# Patient Record
Sex: Female | Born: 1960 | Race: White | Hispanic: No | Marital: Married | Smoking: Never smoker
Health system: Southern US, Community
[De-identification: ages and names within clinical notes are randomized; demographics above are authoritative.]

## PROBLEM LIST (undated history)

## (undated) DIAGNOSIS — IMO0002 Reserved for concepts with insufficient information to code with codable children: Secondary | ICD-10-CM

## (undated) DIAGNOSIS — E7800 Pure hypercholesterolemia, unspecified: Secondary | ICD-10-CM

## (undated) DIAGNOSIS — D649 Anemia, unspecified: Secondary | ICD-10-CM

## (undated) DIAGNOSIS — S129XXA Fracture of neck, unspecified, initial encounter: Secondary | ICD-10-CM

## (undated) DIAGNOSIS — G5600 Carpal tunnel syndrome, unspecified upper limb: Secondary | ICD-10-CM

## (undated) HISTORY — DX: Fracture of neck, unspecified, initial encounter: S12.9XXA

## (undated) HISTORY — PX: FINGER ARTHROSCOPY WITH CARPOMETACARPEL (CMC) ARTHROPLASTY: SHX5629

## (undated) HISTORY — DX: Anemia, unspecified: D64.9

## (undated) HISTORY — PX: COLONOSCOPY: SHX174

## (undated) HISTORY — PX: TONSILLECTOMY: SUR1361

---

## 1997-09-03 HISTORY — PX: CERVICAL FUSION: SHX112

## 2004-12-12 ENCOUNTER — Ambulatory Visit: Payer: Self-pay | Admitting: Family Medicine

## 2005-04-19 ENCOUNTER — Ambulatory Visit: Payer: Self-pay | Admitting: Unknown Physician Specialty

## 2005-06-01 ENCOUNTER — Ambulatory Visit: Payer: Self-pay | Admitting: Unknown Physician Specialty

## 2006-11-27 ENCOUNTER — Ambulatory Visit: Payer: Self-pay

## 2011-03-19 ENCOUNTER — Ambulatory Visit: Payer: Self-pay | Admitting: Gastroenterology

## 2014-04-01 ENCOUNTER — Other Ambulatory Visit: Payer: Self-pay | Admitting: Orthopedic Surgery

## 2014-04-20 ENCOUNTER — Encounter (HOSPITAL_BASED_OUTPATIENT_CLINIC_OR_DEPARTMENT_OTHER): Payer: Self-pay | Admitting: *Deleted

## 2014-04-20 NOTE — Progress Notes (Signed)
No labs needed

## 2014-04-22 ENCOUNTER — Ambulatory Visit (HOSPITAL_BASED_OUTPATIENT_CLINIC_OR_DEPARTMENT_OTHER)
Admission: RE | Admit: 2014-04-22 | Discharge: 2014-04-22 | Disposition: A | Discharge status: Home / Self Care | Payer: PRIVATE HEALTH INSURANCE | Source: Ambulatory Visit | Attending: Orthopedic Surgery | Admitting: Orthopedic Surgery

## 2014-04-22 ENCOUNTER — Encounter (HOSPITAL_BASED_OUTPATIENT_CLINIC_OR_DEPARTMENT_OTHER)
Admission: RE | Disposition: A | Discharge status: Home / Self Care | Payer: Self-pay | Source: Ambulatory Visit | Attending: Orthopedic Surgery

## 2014-04-22 ENCOUNTER — Encounter (HOSPITAL_BASED_OUTPATIENT_CLINIC_OR_DEPARTMENT_OTHER): Payer: PRIVATE HEALTH INSURANCE | Admitting: Anesthesiology

## 2014-04-22 ENCOUNTER — Encounter (HOSPITAL_BASED_OUTPATIENT_CLINIC_OR_DEPARTMENT_OTHER): Payer: Self-pay | Admitting: *Deleted

## 2014-04-22 ENCOUNTER — Ambulatory Visit (HOSPITAL_BASED_OUTPATIENT_CLINIC_OR_DEPARTMENT_OTHER): Payer: PRIVATE HEALTH INSURANCE | Admitting: Anesthesiology

## 2014-04-22 DIAGNOSIS — IMO0002 Reserved for concepts with insufficient information to code with codable children: Secondary | ICD-10-CM | POA: Insufficient documentation

## 2014-04-22 DIAGNOSIS — Z79899 Other long term (current) drug therapy: Secondary | ICD-10-CM | POA: Insufficient documentation

## 2014-04-22 DIAGNOSIS — G5600 Carpal tunnel syndrome, unspecified upper limb: Secondary | ICD-10-CM | POA: Diagnosis present

## 2014-04-22 HISTORY — PX: CARPAL TUNNEL RELEASE: SHX101

## 2014-04-22 HISTORY — DX: Reserved for concepts with insufficient information to code with codable children: IMO0002

## 2014-04-22 HISTORY — DX: Carpal tunnel syndrome, unspecified upper limb: G56.00

## 2014-04-22 LAB — POCT HEMOGLOBIN-HEMACUE: HEMOGLOBIN: 12.5 g/dL (ref 12.0–15.0)

## 2014-04-22 SURGERY — CARPAL TUNNEL RELEASE
Anesthesia: Monitor Anesthesia Care | Site: Wrist | Laterality: Left

## 2014-04-22 MED ORDER — MIDAZOLAM HCL 2 MG/2ML IJ SOLN: 1.0000 mg | INTRAMUSCULAR | Status: DC | PRN

## 2014-04-22 MED ORDER — HYDROMORPHONE HCL PF 1 MG/ML IJ SOLN: 0.2500 mg | INTRAMUSCULAR | Status: DC | PRN

## 2014-04-22 MED ORDER — CEFAZOLIN SODIUM-DEXTROSE 2-3 GM-% IV SOLR
2.0000 g | INTRAVENOUS | Status: AC
  Administered 2014-04-22: 2 g via INTRAVENOUS

## 2014-04-22 MED ORDER — LACTATED RINGERS IV SOLN
INTRAVENOUS | Status: DC
  Administered 2014-04-22: 08:00:00 via INTRAVENOUS

## 2014-04-22 MED ORDER — ONDANSETRON HCL 4 MG/2ML IJ SOLN
INTRAMUSCULAR | Status: DC | PRN
  Administered 2014-04-22: 4 mg via INTRAVENOUS

## 2014-04-22 MED ORDER — FENTANYL CITRATE 0.05 MG/ML IJ SOLN: 50.0000 ug | INTRAMUSCULAR | Status: DC | PRN

## 2014-04-22 MED ORDER — OXYCODONE HCL 5 MG/5ML PO SOLN: 5.0000 mg | Freq: Once | ORAL | Status: DC | PRN

## 2014-04-22 MED ORDER — FENTANYL CITRATE 0.05 MG/ML IJ SOLN
INTRAMUSCULAR | Status: DC | PRN
  Administered 2014-04-22: 50 ug via INTRAVENOUS

## 2014-04-22 MED ORDER — CEFAZOLIN SODIUM-DEXTROSE 2-3 GM-% IV SOLR
INTRAVENOUS | Status: AC
  Filled 2014-04-22: qty 50

## 2014-04-22 MED ORDER — PROPOFOL INFUSION 10 MG/ML OPTIME
INTRAVENOUS | Status: DC | PRN
  Administered 2014-04-22: 50 ug/kg/min via INTRAVENOUS

## 2014-04-22 MED ORDER — LIDOCAINE HCL (PF) 0.5 % IJ SOLN
INTRAMUSCULAR | Status: DC | PRN
  Administered 2014-04-22: 25 mL via INTRAVENOUS

## 2014-04-22 MED ORDER — OXYCODONE HCL 5 MG PO TABS: 5.0000 mg | ORAL_TABLET | Freq: Once | ORAL | Status: DC | PRN

## 2014-04-22 MED ORDER — 0.9 % SODIUM CHLORIDE (POUR BTL) OPTIME
TOPICAL | Status: DC | PRN
  Administered 2014-04-22: 500 mL

## 2014-04-22 MED ORDER — MIDAZOLAM HCL 5 MG/5ML IJ SOLN
INTRAMUSCULAR | Status: DC | PRN
  Administered 2014-04-22: 2 mg via INTRAVENOUS

## 2014-04-22 MED ORDER — ONDANSETRON HCL 4 MG/2ML IJ SOLN
4.0000 mg | Freq: Once | INTRAMUSCULAR | Status: DC | PRN
Start: 2014-04-22 — End: 2014-04-22

## 2014-04-22 MED ORDER — CHLORHEXIDINE GLUCONATE 4 % EX LIQD: 60.0000 mL | Freq: Once | CUTANEOUS | Status: DC

## 2014-04-22 MED ORDER — BUPIVACAINE HCL (PF) 0.25 % IJ SOLN
INTRAMUSCULAR | Status: DC | PRN
  Administered 2014-04-22: 10 mL

## 2014-04-22 MED ORDER — BUPIVACAINE HCL (PF) 0.25 % IJ SOLN
INTRAMUSCULAR | Status: AC
  Filled 2014-04-22: qty 30

## 2014-04-22 MED ORDER — FENTANYL CITRATE 0.05 MG/ML IJ SOLN
INTRAMUSCULAR | Status: AC
  Filled 2014-04-22: qty 4

## 2014-04-22 MED ORDER — MIDAZOLAM HCL 2 MG/2ML IJ SOLN
INTRAMUSCULAR | Status: AC
  Filled 2014-04-22: qty 2

## 2014-04-22 MED ORDER — HYDROCODONE-ACETAMINOPHEN 5-325 MG PO TABS: ORAL_TABLET | ORAL | Status: DC

## 2014-04-22 SURGICAL SUPPLY — 36 items
BANDAGE ELASTIC 3 VELCRO ST LF (GAUZE/BANDAGES/DRESSINGS) ×2 IMPLANT
BLADE MINI RND TIP GREEN BEAV (BLADE) IMPLANT
BLADE SURG 15 STRL LF DISP TIS (BLADE) ×2 IMPLANT
BLADE SURG 15 STRL SS (BLADE) ×2
BNDG ESMARK 4X9 LF (GAUZE/BANDAGES/DRESSINGS) IMPLANT
BNDG GAUZE ELAST 4 BULKY (GAUZE/BANDAGES/DRESSINGS) ×2 IMPLANT
CHLORAPREP W/TINT 26ML (MISCELLANEOUS) ×2 IMPLANT
CORDS BIPOLAR (ELECTRODE) ×2 IMPLANT
COVER MAYO STAND STRL (DRAPES) ×2 IMPLANT
COVER TABLE BACK 60X90 (DRAPES) ×2 IMPLANT
CUFF TOURNIQUET SINGLE 18IN (TOURNIQUET CUFF) ×2 IMPLANT
DRAPE EXTREMITY T 121X128X90 (DRAPE) ×2 IMPLANT
DRAPE SURG 17X23 STRL (DRAPES) ×2 IMPLANT
DRSG PAD ABDOMINAL 8X10 ST (GAUZE/BANDAGES/DRESSINGS) ×2 IMPLANT
GAUZE SPONGE 4X4 12PLY STRL (GAUZE/BANDAGES/DRESSINGS) ×2 IMPLANT
GAUZE XEROFORM 1X8 LF (GAUZE/BANDAGES/DRESSINGS) ×2 IMPLANT
GLOVE BIO SURGEON STRL SZ7.5 (GLOVE) ×2 IMPLANT
GLOVE BIOGEL PI IND STRL 7.0 (GLOVE) ×1 IMPLANT
GLOVE BIOGEL PI IND STRL 8 (GLOVE) ×1 IMPLANT
GLOVE BIOGEL PI INDICATOR 7.0 (GLOVE) ×1
GLOVE BIOGEL PI INDICATOR 8 (GLOVE) ×1
GLOVE ECLIPSE 7.0 STRL STRAW (GLOVE) ×2 IMPLANT
GOWN STRL REUS W/ TWL LRG LVL3 (GOWN DISPOSABLE) ×1 IMPLANT
GOWN STRL REUS W/TWL LRG LVL3 (GOWN DISPOSABLE) ×1
GOWN STRL REUS W/TWL XL LVL3 (GOWN DISPOSABLE) ×2 IMPLANT
NEEDLE HYPO 25X1 1.5 SAFETY (NEEDLE) ×2 IMPLANT
NS IRRIG 1000ML POUR BTL (IV SOLUTION) ×2 IMPLANT
PACK BASIN DAY SURGERY FS (CUSTOM PROCEDURE TRAY) ×2 IMPLANT
PADDING CAST ABS 4INX4YD NS (CAST SUPPLIES)
PADDING CAST ABS COTTON 4X4 ST (CAST SUPPLIES) IMPLANT
STOCKINETTE 4X48 STRL (DRAPES) ×2 IMPLANT
SUT ETHILON 4 0 PS 2 18 (SUTURE) ×2 IMPLANT
SYR BULB 3OZ (MISCELLANEOUS) ×2 IMPLANT
SYR CONTROL 10ML LL (SYRINGE) ×2 IMPLANT
TOWEL OR 17X24 6PK STRL BLUE (TOWEL DISPOSABLE) ×2 IMPLANT
UNDERPAD 30X30 INCONTINENT (UNDERPADS AND DIAPERS) ×2 IMPLANT

## 2014-04-22 NOTE — Anesthesia Preprocedure Evaluation (Addendum)
Anesthesia Evaluation  Patient identified by MRN, date of birth, ID band Patient awake    Reviewed: Allergy & Precautions, H&P , NPO status , Patient's Chart, lab work & pertinent test results  Airway Mallampati: I TM Distance: >3 FB Neck ROM: Full    Dental  (+) Teeth Intact, Dental Advisory Given   Pulmonary  breath sounds clear to auscultation        Cardiovascular Rhythm:Regular Rate:Normal     Neuro/Psych    GI/Hepatic   Endo/Other    Renal/GU      Musculoskeletal   Abdominal   Peds  Hematology   Anesthesia Other Findings   Reproductive/Obstetrics                           Anesthesia Physical Anesthesia Plan  ASA: I  Anesthesia Plan: MAC and Bier Block   Post-op Pain Management:    Induction: Intravenous  Airway Management Planned: Simple Face Mask  Additional Equipment:   Intra-op Plan:   Post-operative Plan:   Informed Consent: I have reviewed the patients History and Physical, chart, labs and discussed the procedure including the risks, benefits and alternatives for the proposed anesthesia with the patient or authorized representative who has indicated his/her understanding and acceptance.   Dental advisory given  Plan Discussed with: CRNA, Anesthesiologist and Surgeon  Anesthesia Plan Comments:         Anesthesia Quick Evaluation

## 2014-04-22 NOTE — Anesthesia Procedure Notes (Signed)
Anesthesia Regional Block:  Bier block (IV Regional)  Pre-Anesthetic Checklist: ,, timeout performed, Correct Patient, Correct Site, Correct Laterality, Correct Procedure,, site marked, surgical consent,, at surgeon's request Needles:  Injection technique: Single-shot  Needle Type: Other      Needle Gauge: 20 and 20 G    Additional Needles: Bier block (IV Regional)  Nerve Stimulator or Paresthesia:   Additional Responses:  Pulse checked post tourniquet inflation. IV NSL discontinued post injection. Narrative:   Performed by: Personally       

## 2014-04-22 NOTE — Op Note (Signed)
NAMERondalyn, Johns NO.:  [id number]  MEDICAL RECORD NO.:  [id number]  LOCATION:                                 FACILITY:  PHYSICIAN:  Leanora Cover, MD             DATE OF BIRTH:  DATE OF PROCEDURE:  04/22/2014 DATE OF DISCHARGE:                              OPERATIVE REPORT   PREOPERATIVE DIAGNOSIS:  Left carpal tunnel syndrome.  POSTOPERATIVE DIAGNOSIS:  Left carpal tunnel syndrome.  PROCEDURE:  Left carpal tunnel release.  SURGEON:  Leanora Cover, MD  ASSISTANT:  None.  ANESTHESIA:  Bier block with sedation.  IV FLUIDS:  Per anesthesia flow sheet.  ESTIMATED BLOOD LOSS:  Minimal.  COMPLICATIONS:  None.  SPECIMENS:  None.  TOURNIQUET TIME:  25 minutes.  DISPOSITION:  Stable to PACU.  INDICATIONS:  Joy Johns is a 53 year old female who has had pin and needle sensation in the bilateral hands.  She has positive nerve conduction studies.  She wished to have a carpal tunnel release for management of her symptoms.  Risks, benefits, alternatives of the surgery were discussed including risk of blood loss, infection, damage to nerves, vessels, tendons, ligaments, bone; failure of surgery; need for additional surgery, complications with wound healing, continued pain, continued carpal tunnel syndrome.  She voiced understanding of these risks and elected to proceed.  OPERATIVE COURSE:  After being identified preoperatively by myself, the patient and I agreed upon procedure and site of procedure.  Surgical site was marked.  The risks, benefits, and alternatives of surgery were reviewed and she wished to proceed.  Surgical consent had been signed. She was given IV Ancef as preoperative antibiotic prophylaxis.  She was transferred to the operating room and placed on the operating table in supine position with the left upper extremity on arm board.  Bier block anesthesia was induced by the anesthesiologist.  The left upper extremity was prepped and draped in  normal sterile orthopedic fashion. Surgical pause was performed between surgeons, Anesthesia, and operating staff, and all were in agreement as to the patient, procedure, and site of procedure.  Tourniquet at the proximal aspect of the forearm had been inflated for the Bier block.  Incision was made over the transverse carpal ligament and carried into subcutaneous tissues by spreading technique.  Bipolar electrocautery was used to obtain hemostasis.  The transverse carpal ligament was identified and sharply incised.  It was incised distally.  Care was taken to ensure complete decompression distally.  It was then incised proximally.  Scissors were used to split the distal aspect of ulnar and brachial fascia.  A finger was placed into the wound to ensure complete decompression which was the case.  The nerve was inspected which was flattened and hyperemic.  The motor branch was identified and was intact.  The wound was copiously irrigated with sterile saline.  It was closed with 4-0 nylon in a horizontal mattress fashion and then injected with 10 mL of 0.25% plain Marcaine to aid in postoperative analgesia.  It was dressed with sterile Xeroform, 4x4s, and ABD and wrapped with Kerlix and Ace  bandage.  Tourniquet was deflated at 25 minutes.  The fingertips were pink with brisk capillary refill after deflation of tourniquet.  The operative drapes were broken down.  The patient was awoken from anesthesia safely.  She was transferred back to the stretcher and taken to PACU in stable condition. I will see her back in the office in 1 week for postoperative followup. I will give her Norco 5/325, 1-2 p.o. q.6 hours p.r.n. pain, dispensed #30.     Leanora Cover, MD     KK/MEDQ  D:  04/22/2014  T:  04/22/2014  Job:  [id number]

## 2014-04-22 NOTE — Op Note (Signed)
[id number] 

## 2014-04-22 NOTE — H&P (Signed)
  Joy Johns is an 53 y.o. female.   Chief Complaint: carpal tunnel syndrome HPI: 53 yo rhd female with pins and needles sensation in bilateral hands ~4 months.  It is bothersome to her.  Positive nerve conduction studies.  She wishes to have a carpal tunnel release for management of symptoms.  Past Medical History  Diagnosis Date  . Carpal tunnel syndrome   . DDD (degenerative disc disease)     Past Surgical History  Procedure Laterality Date  . Tonsillectomy    . Colonoscopy    . Finger arthroscopy with carpometacarpel (cmc) arthroplasty      both thumbs  . Cervical fusion  1999    History reviewed. No pertinent family history. Social History:  reports that she has never smoked. She does not have any smokeless tobacco history on file. She reports that she drinks alcohol. She reports that she does not use illicit drugs.  Allergies:  Allergies  Allergen Reactions  . Latex     headache  . Shrimp [Shellfish Allergy]     Headache-congestion    Medications Prior to Admission  Medication Sig Dispense Refill  . calcium carbonate (OS-CAL) 600 MG TABS tablet Take 600 mg by mouth 2 (two) times daily with a meal.      . docusate sodium (COLACE) 100 MG capsule Take 100 mg by mouth daily.      . Multiple Vitamins-Minerals (MULTIVITAMIN WITH MINERALS) tablet Take 1 tablet by mouth daily.      . sertraline (ZOLOFT) 50 MG tablet Take 50 mg by mouth at bedtime.        Results for orders placed during the hospital encounter of 04/22/14 (from the past 48 hour(s))  POCT HEMOGLOBIN-HEMACUE     Status: None   Collection Time    04/22/14  7:31 AM      Result Value Ref Range   Hemoglobin 12.5  12.0 - 15.0 g/dL    No results found.   A comprehensive review of systems was negative except for: Eyes: positive for contacts/glasses Behavioral/Psych: positive for depression  Blood pressure 106/67, pulse 63, temperature 97.8 F (36.6 C), temperature source Oral, resp. rate 18, height 5\' 2"   (1.575 m), weight 60.782 kg (134 lb), SpO2 100.00%.  General appearance: alert, cooperative and appears stated age Head: Normocephalic, without obvious abnormality, atraumatic Neck: supple, symmetrical, trachea midline Resp: clear to auscultation bilaterally Cardio: regular rate and rhythm GI: non tender Extremities: intact sensation and capillary refill all digits.  +epl/fpl/io.  no wounds. Pulses: 2+ and symmetric Skin: Skin color, texture, turgor normal. No rashes or lesions Neurologic: Grossly normal Incision/Wound: none  Assessment/Plan Left carpal tunnel syndrome.  Non operative and operative treatment options were discussed with the patient and patient wishes to proceed with operative treatment. Risks, benefits, and alternatives of surgery were discussed and the patient agrees with the plan of care.   Jannett Schmall R 04/22/2014, 8:32 AM

## 2014-04-22 NOTE — Discharge Instructions (Addendum)

## 2014-04-22 NOTE — Anesthesia Postprocedure Evaluation (Signed)
  Anesthesia Post-op Note  Patient: Joy Johns  Procedure(s) Performed: Procedure(s): LEFT CARPAL TUNNEL RELEASE (Left)  Patient Location: PACU  Anesthesia Type: MAC, Bier Block   Level of Consciousness: awake, alert  and oriented  Airway and Oxygen Therapy: Patient Spontanous Breathing  Post-op Pain: mild  Post-op Assessment: Post-op Vital signs reviewed  Post-op Vital Signs: Reviewed  Last Vitals:  Filed Vitals:   04/22/14 0950  BP: 94/63  Pulse: 58  Temp: 36.6 C  Resp: 16    Complications: No apparent anesthesia complications

## 2014-04-22 NOTE — Transfer of Care (Signed)
Immediate Anesthesia Transfer of Care Note  Patient: Joy Johns  Procedure(s) Performed: Procedure(s): LEFT CARPAL TUNNEL RELEASE (Left)  Patient Location: PACU  Anesthesia Type:MAC and Bier block  Level of Consciousness: awake, alert  and oriented  Airway & Oxygen Therapy: Patient Spontanous Breathing  Post-op Assessment: Report given to PACU RN and Post -op Vital signs reviewed and stable  Post vital signs: Reviewed and stable  Complications: No apparent anesthesia complications

## 2014-04-22 NOTE — Brief Op Note (Signed)
04/22/2014  9:15 AM  PATIENT:  Joy Johns  53 y.o. female  PRE-OPERATIVE DIAGNOSIS:  LEFT CARPAL TUNNEL SYNDROME   POST-OPERATIVE DIAGNOSIS:  LEFT CARPAL TUNNEL SYNDROME   PROCEDURE:  Procedure(s): LEFT CARPAL TUNNEL RELEASE (Left)  SURGEON:  Surgeon(s) and Role:    * Leanora Cover, MD - Primary  PHYSICIAN ASSISTANT:   ASSISTANTS: none   ANESTHESIA:   Bier block with sedation  EBL:  Total I/O In: 800 [I.V.:800] Out: 0   BLOOD ADMINISTERED:none  DRAINS: none   LOCAL MEDICATIONS USED:  MARCAINE     SPECIMEN:  No Specimen  DISPOSITION OF SPECIMEN:  N/A  COUNTS:  YES  TOURNIQUET:   Total Tourniquet Time Documented: Forearm (Left) - 25 minutes Total: Forearm (Left) - 25 minutes   DICTATION: .Other Dictation: Dictation Number [id number]  PLAN OF CARE: Discharge to home after PACU  PATIENT DISPOSITION:  PACU - hemodynamically stable.

## 2014-04-23 ENCOUNTER — Encounter (HOSPITAL_BASED_OUTPATIENT_CLINIC_OR_DEPARTMENT_OTHER): Payer: Self-pay | Admitting: Orthopedic Surgery

## 2014-04-23 NOTE — Addendum Note (Signed)
Addendum created 04/23/14 1217 by Tawni Millers, CRNA   Modules edited: Charges VN

## 2014-08-12 ENCOUNTER — Other Ambulatory Visit: Payer: Self-pay | Admitting: Orthopedic Surgery

## 2014-08-17 ENCOUNTER — Encounter (HOSPITAL_BASED_OUTPATIENT_CLINIC_OR_DEPARTMENT_OTHER): Payer: Self-pay | Admitting: *Deleted

## 2014-08-17 NOTE — Progress Notes (Signed)
Here 8/15 for [facility]-did well

## 2014-08-19 ENCOUNTER — Encounter (HOSPITAL_BASED_OUTPATIENT_CLINIC_OR_DEPARTMENT_OTHER)
Admission: RE | Disposition: A | Discharge status: Home / Self Care | Payer: Self-pay | Source: Ambulatory Visit | Attending: Orthopedic Surgery

## 2014-08-19 ENCOUNTER — Ambulatory Visit (HOSPITAL_BASED_OUTPATIENT_CLINIC_OR_DEPARTMENT_OTHER): Payer: PRIVATE HEALTH INSURANCE | Admitting: Certified Registered"

## 2014-08-19 ENCOUNTER — Ambulatory Visit (HOSPITAL_BASED_OUTPATIENT_CLINIC_OR_DEPARTMENT_OTHER)
Admission: RE | Admit: 2014-08-19 | Discharge: 2014-08-19 | Disposition: A | Discharge status: Home / Self Care | Payer: PRIVATE HEALTH INSURANCE | Source: Ambulatory Visit | Attending: Orthopedic Surgery | Admitting: Orthopedic Surgery

## 2014-08-19 ENCOUNTER — Encounter (HOSPITAL_BASED_OUTPATIENT_CLINIC_OR_DEPARTMENT_OTHER): Payer: Self-pay | Admitting: *Deleted

## 2014-08-19 DIAGNOSIS — G5601 Carpal tunnel syndrome, right upper limb: Secondary | ICD-10-CM | POA: Insufficient documentation

## 2014-08-19 DIAGNOSIS — Z9889 Other specified postprocedural states: Secondary | ICD-10-CM | POA: Diagnosis not present

## 2014-08-19 DIAGNOSIS — M1990 Unspecified osteoarthritis, unspecified site: Secondary | ICD-10-CM | POA: Diagnosis not present

## 2014-08-19 HISTORY — PX: CARPAL TUNNEL RELEASE: SHX101

## 2014-08-19 LAB — POCT HEMOGLOBIN-HEMACUE: Hemoglobin: 13 g/dL (ref 12.0–15.0)

## 2014-08-19 SURGERY — CARPAL TUNNEL RELEASE
Anesthesia: Monitor Anesthesia Care | Site: Hand | Laterality: Right

## 2014-08-19 MED ORDER — PROPOFOL 10 MG/ML IV BOLUS
INTRAVENOUS | Status: AC
  Filled 2014-08-19: qty 20

## 2014-08-19 MED ORDER — ONDANSETRON HCL 4 MG/2ML IJ SOLN
INTRAMUSCULAR | Status: DC | PRN
  Administered 2014-08-19: 4 mg via INTRAVENOUS

## 2014-08-19 MED ORDER — PROPOFOL INFUSION 10 MG/ML OPTIME
INTRAVENOUS | Status: DC | PRN
  Administered 2014-08-19: 50 ug/kg/min via INTRAVENOUS

## 2014-08-19 MED ORDER — MIDAZOLAM HCL 2 MG/2ML IJ SOLN
INTRAMUSCULAR | Status: AC
  Filled 2014-08-19: qty 2

## 2014-08-19 MED ORDER — FENTANYL CITRATE 0.05 MG/ML IJ SOLN
INTRAMUSCULAR | Status: AC
  Filled 2014-08-19: qty 4

## 2014-08-19 MED ORDER — LIDOCAINE HCL (PF) 0.5 % IJ SOLN
INTRAMUSCULAR | Status: DC | PRN
  Administered 2014-08-19: 25 mL via INTRAVENOUS

## 2014-08-19 MED ORDER — FENTANYL CITRATE 0.05 MG/ML IJ SOLN
INTRAMUSCULAR | Status: DC | PRN
  Administered 2014-08-19 (×2): 50 ug via INTRAVENOUS

## 2014-08-19 MED ORDER — LACTATED RINGERS IV SOLN
INTRAVENOUS | Status: DC
  Administered 2014-08-19: 11:00:00 via INTRAVENOUS

## 2014-08-19 MED ORDER — CHLORHEXIDINE GLUCONATE 4 % EX LIQD: 60.0000 mL | Freq: Once | CUTANEOUS | Status: DC

## 2014-08-19 MED ORDER — HYDROCODONE-ACETAMINOPHEN 5-325 MG PO TABS: ORAL_TABLET | ORAL | Status: DC

## 2014-08-19 MED ORDER — FENTANYL CITRATE 0.05 MG/ML IJ SOLN: 50.0000 ug | INTRAMUSCULAR | Status: DC | PRN

## 2014-08-19 MED ORDER — MIDAZOLAM HCL 2 MG/2ML IJ SOLN: 1.0000 mg | INTRAMUSCULAR | Status: DC | PRN

## 2014-08-19 MED ORDER — BUPIVACAINE HCL (PF) 0.25 % IJ SOLN
INTRAMUSCULAR | Status: DC | PRN
  Administered 2014-08-19: 10 mL

## 2014-08-19 MED ORDER — MIDAZOLAM HCL 5 MG/5ML IJ SOLN
INTRAMUSCULAR | Status: DC | PRN
  Administered 2014-08-19: 2 mg via INTRAVENOUS

## 2014-08-19 MED ORDER — ONDANSETRON HCL 4 MG/2ML IJ SOLN: 4.0000 mg | Freq: Four times a day (QID) | INTRAMUSCULAR | Status: DC | PRN

## 2014-08-19 MED ORDER — CEFAZOLIN SODIUM-DEXTROSE 2-3 GM-% IV SOLR
2.0000 g | INTRAVENOUS | Status: AC
  Administered 2014-08-19: 2 g via INTRAVENOUS

## 2014-08-19 MED ORDER — FENTANYL CITRATE 0.05 MG/ML IJ SOLN: 25.0000 ug | INTRAMUSCULAR | Status: DC | PRN

## 2014-08-19 SURGICAL SUPPLY — 36 items
BANDAGE ELASTIC 3 VELCRO ST LF (GAUZE/BANDAGES/DRESSINGS) ×2 IMPLANT
BLADE MINI RND TIP GREEN BEAV (BLADE) IMPLANT
BLADE SURG 15 STRL LF DISP TIS (BLADE) ×2 IMPLANT
BLADE SURG 15 STRL SS (BLADE) ×2
BNDG ESMARK 4X9 LF (GAUZE/BANDAGES/DRESSINGS) ×2 IMPLANT
BNDG GAUZE ELAST 4 BULKY (GAUZE/BANDAGES/DRESSINGS) ×2 IMPLANT
CHLORAPREP W/TINT 26ML (MISCELLANEOUS) ×2 IMPLANT
CORDS BIPOLAR (ELECTRODE) ×2 IMPLANT
COVER BACK TABLE 60X90IN (DRAPES) ×2 IMPLANT
COVER MAYO STAND STRL (DRAPES) ×2 IMPLANT
CUFF TOURNIQUET SINGLE 18IN (TOURNIQUET CUFF) ×2 IMPLANT
DRAPE EXTREMITY T 121X128X90 (DRAPE) ×2 IMPLANT
DRAPE SURG 17X23 STRL (DRAPES) ×2 IMPLANT
DRSG PAD ABDOMINAL 8X10 ST (GAUZE/BANDAGES/DRESSINGS) ×2 IMPLANT
GAUZE SPONGE 4X4 12PLY STRL (GAUZE/BANDAGES/DRESSINGS) ×2 IMPLANT
GAUZE XEROFORM 1X8 LF (GAUZE/BANDAGES/DRESSINGS) ×2 IMPLANT
GLOVE BIO SURGEON STRL SZ7.5 (GLOVE) ×2 IMPLANT
GLOVE BIOGEL PI IND STRL 8 (GLOVE) ×1 IMPLANT
GLOVE BIOGEL PI INDICATOR 8 (GLOVE) ×1
GLOVE SURG SS PI 6.5 STRL IVOR (GLOVE) ×2 IMPLANT
GLOVE SURG SS PI 7.0 STRL IVOR (GLOVE) ×2 IMPLANT
GLOVE SURG SS PI 7.5 STRL IVOR (GLOVE) ×2 IMPLANT
GOWN STRL REUS W/ TWL LRG LVL3 (GOWN DISPOSABLE) ×1 IMPLANT
GOWN STRL REUS W/TWL LRG LVL3 (GOWN DISPOSABLE) ×1
GOWN STRL REUS W/TWL XL LVL3 (GOWN DISPOSABLE) ×2 IMPLANT
NEEDLE HYPO 25X1 1.5 SAFETY (NEEDLE) ×2 IMPLANT
NS IRRIG 1000ML POUR BTL (IV SOLUTION) ×2 IMPLANT
PACK BASIN DAY SURGERY FS (CUSTOM PROCEDURE TRAY) ×4 IMPLANT
PADDING CAST ABS 4INX4YD NS (CAST SUPPLIES)
PADDING CAST ABS COTTON 4X4 ST (CAST SUPPLIES) IMPLANT
STOCKINETTE 4X48 STRL (DRAPES) ×2 IMPLANT
SUT ETHILON 4 0 PS 2 18 (SUTURE) ×2 IMPLANT
SYR BULB 3OZ (MISCELLANEOUS) ×2 IMPLANT
SYR CONTROL 10ML LL (SYRINGE) ×2 IMPLANT
TOWEL OR 17X24 6PK STRL BLUE (TOWEL DISPOSABLE) ×4 IMPLANT
UNDERPAD 30X30 INCONTINENT (UNDERPADS AND DIAPERS) IMPLANT

## 2014-08-19 NOTE — H&P (Signed)
  Joy Johns is an 53 y.o. female.   Chief Complaint: right carpal tunnel syndrome HPI: 53 yo rhd female with pins and needles sensation in right hand x 6 months.  Nocturnal symptoms wake her 4-5 times per week.  Positive nerve conduction studies.  She wishes to have a right carpal tunnel release for management of symptoms.  She has had a left carpal tunnel release with good relief.  Past Medical History  Diagnosis Date  . Carpal tunnel syndrome   . DDD (degenerative disc disease)     Past Surgical History  Procedure Laterality Date  . Tonsillectomy    . Colonoscopy    . Finger arthroscopy with carpometacarpel (cmc) arthroplasty      both thumbs  . Cervical fusion  1999  . Carpal tunnel release Left 04/22/2014    Procedure: LEFT CARPAL TUNNEL RELEASE;  Surgeon: Leanora Cover, MD;  Location: [place];  Service: Orthopedics;  Laterality: Left;    History reviewed. No pertinent family history. Social History:  reports that she has never smoked. She does not have any smokeless tobacco history on file. She reports that she drinks alcohol. She reports that she does not use illicit drugs.  Allergies:  Allergies  Allergen Reactions  . Latex     headache  . Shrimp [Shellfish Allergy]     Headache-congestion    No prescriptions prior to admission    No results found for this or any previous visit (from the past 48 hour(s)).  No results found.   A comprehensive review of systems was negative except for: Eyes: positive for contacts/glasses Behavioral/Psych: positive for depression  Height 5\' 2"  (1.575 m), weight 61.236 kg (135 lb).  General appearance: alert, cooperative and appears stated age Head: Normocephalic, without obvious abnormality, atraumatic Neck: supple, symmetrical, trachea midline Resp: clear to auscultation bilaterally Cardio: regular rate and rhythm GI: non tender Extremities: intact sensation and capillary refill all digits.  +epl/fpl/io.  no  wounds. Pulses: 2+ and symmetric Skin: Skin color, texture, turgor normal. No rashes or lesions Neurologic: Grossly normal Incision/Wound: none  Assessment/Plan Right carpal tunnel syndrome.  Non operative and operative treatment options were discussed with the patient and patient wishes to proceed with operative treatment. Risks, benefits, and alternatives of surgery were discussed and the patient agrees with the plan of care.   [name] 08/19/2014, 8:28 AM

## 2014-08-19 NOTE — Transfer of Care (Signed)
Immediate Anesthesia Transfer of Care Note  Patient: Joy Johns  Procedure(s) Performed: Procedure(s): RIGHT CARPAL TUNNEL RELEASE (Right)  Patient Location: PACU  Anesthesia Type:MAC and Bier block  Level of Consciousness: awake, alert  and oriented  Airway & Oxygen Therapy: Patient Spontanous Breathing and Patient connected to face mask oxygen  Post-op Assessment: Report given to PACU RN, Post -op Vital signs reviewed and stable and Patient moving all extremities  Post vital signs: Reviewed and stable  Complications: No apparent anesthesia complications

## 2014-08-19 NOTE — Op Note (Signed)
[id number] 

## 2014-08-19 NOTE — Brief Op Note (Signed)
08/19/2014  12:04 PM  PATIENT:  Joy Johns  53 y.o. female  PRE-OPERATIVE DIAGNOSIS:  RIGHT CARPAL TUNNEL SYNDROME  POST-OPERATIVE DIAGNOSIS:  RIGHT CARPAL TUNNEL SYNDROME  PROCEDURE:  Procedure(s): RIGHT CARPAL TUNNEL RELEASE (Right)  SURGEON:  Surgeon(s) and Role:    * Leanora Cover, MD - Primary  PHYSICIAN ASSISTANT:   ASSISTANTS: none   ANESTHESIA:   Bier block  EBL:  Total I/O In: 500 [I.V.:500] Out: -   BLOOD ADMINISTERED:none  DRAINS: none   LOCAL MEDICATIONS USED:  MARCAINE     SPECIMEN:  No Specimen  DISPOSITION OF SPECIMEN:  N/A  COUNTS:  YES  TOURNIQUET:   Total Tourniquet Time Documented: Upper Arm (Right) - 27 minutes Total: Upper Arm (Right) - 27 minutes   DICTATION: .Other Dictation: Dictation Number [id number]  PLAN OF CARE: Discharge to home after PACU  PATIENT DISPOSITION:  PACU - hemodynamically stable.

## 2014-08-19 NOTE — Discharge Instructions (Addendum)
°  Post Anesthesia Home Care Instructions  Activity: Get plenty of rest for the remainder of the day. A responsible adult should stay with you for 24 hours following the procedure.  For the next 24 hours, DO NOT: -Drive a car -Paediatric nurse -Drink alcoholic beverages -Take any medication unless instructed by your physician -Make any legal decisions or sign important papers.  Meals: Start with liquid foods such as gelatin or soup. Progress to regular foods as tolerated. Avoid greasy, spicy, heavy foods. If nausea and/or vomiting occur, drink only clear liquids until the nausea and/or vomiting subsides. Call your physician if vomiting continues.  Special Instructions/Symptoms: Your throat may feel dry or sore from the anesthesia or the breathing tube placed in your throat during surgery. If this causes discomfort, gargle with warm salt water. The discomfort should disappear within 24 hours.        HAND SURGERY    HOME CARE INSTRUCTIONS    The following instructions have been prepared to help you care for yourself upon your return home today.  Wound Care:  Keep your hand elevated above the level of your heart. Do not allow it to dangle by your side. Keep the dressing dry and do not remove it unless your doctor advises you to do so. He will usually change it at the time of you post-op visit. Moving your fingers is advised to stimulate circulation but will depend on the site of your surgery. Of course, if you have a splint applied your doctor will advise you about movement.  Activity:  Do not drive or operate machinery today. Rest today and then you may return to your normal activity and work as indicated by your physician.  Diet: Drink liquids today or eat a light diet. You may resume a regular diet tomorrow.  General expectations: Pain for two or three days. Fingers may become slightly swollen.   Unexpected Observations- Call your doctor if any of these occur: Severe pain not  relieved by pain medication. Elevated temperature. Dressing soaked with blood. Inability to move fingers. White or bluish color to fingers.

## 2014-08-19 NOTE — Anesthesia Preprocedure Evaluation (Signed)
Anesthesia Evaluation  Patient identified by MRN, date of birth, ID band Patient awake    Reviewed: Allergy & Precautions, H&P , NPO status , Patient's Chart, lab work & pertinent test results  Airway Mallampati: II   Neck ROM: full    Dental   Pulmonary          Cardiovascular negative cardio ROS      Neuro/Psych  Neuromuscular disease    GI/Hepatic   Endo/Other    Renal/GU      Musculoskeletal  (+) Arthritis -,   Abdominal   Peds  Hematology   Anesthesia Other Findings   Reproductive/Obstetrics                             Anesthesia Physical Anesthesia Plan  ASA: II  Anesthesia Plan: MAC and Bier Block   Post-op Pain Management:    Induction: Intravenous  Airway Management Planned: Simple Face Mask  Additional Equipment:   Intra-op Plan:   Post-operative Plan:   Informed Consent: I have reviewed the patients History and Physical, chart, labs and discussed the procedure including the risks, benefits and alternatives for the proposed anesthesia with the patient or authorized representative who has indicated his/her understanding and acceptance.     Plan Discussed with: CRNA, Anesthesiologist and Surgeon  Anesthesia Plan Comments:         Anesthesia Quick Evaluation

## 2014-08-19 NOTE — Anesthesia Procedure Notes (Signed)
Procedure Name: MAC Date/Time: 08/19/2014 11:32 AM Performed by: Baxter Flattery Pre-anesthesia Checklist: Patient identified, Emergency Drugs available, Suction available and Patient being monitored Patient Re-evaluated:Patient Re-evaluated prior to inductionOxygen Delivery Method: Simple face mask Preoxygenation: Pre-oxygenation with 100% oxygen Dental Injury: Teeth and Oropharynx as per pre-operative assessment

## 2014-08-19 NOTE — Anesthesia Postprocedure Evaluation (Signed)
Anesthesia Post Note  Patient: Joy Johns  Procedure(s) Performed: Procedure(s) (LRB): RIGHT CARPAL TUNNEL RELEASE (Right)  Anesthesia type: MAC  Patient location: PACU  Post pain: Pain level controlled and Adequate analgesia  Post assessment: Post-op Vital signs reviewed, Patient's Cardiovascular Status Stable and Respiratory Function Stable  Last Vitals:  Filed Vitals:   08/19/14 1215  BP: 93/55  Pulse: 56  Temp:   Resp: 15    Post vital signs: Reviewed and stable  Level of consciousness: awake, alert  and oriented  Complications: No apparent anesthesia complications

## 2014-08-20 NOTE — Op Note (Signed)
Joy Johns, Joy Johns.:  [id number]  MEDICAL RECORD NO.:  [id number]  LOCATION:                                 FACILITY:  PHYSICIAN:  Leanora Cover, MD             DATE OF BIRTH:  DATE OF PROCEDURE:  08/19/2014 DATE OF DISCHARGE:                              OPERATIVE REPORT   PREOPERATIVE DIAGNOSIS:  Right carpal tunnel syndrome.  POSTOPERATIVE DIAGNOSIS:  Right carpal tunnel syndrome.  PROCEDURE:  Right carpal tunnel release.  SURGEON:  Leanora Cover, MD  ASSISTANT:  None.  ANESTHESIA:  Bier block.  IV FLUIDS:  Per anesthesia flow sheet.  ESTIMATED BLOOD LOSS:  Minimal.  COMPLICATIONS:  None.  SPECIMENS:  None.  TOURNIQUET TIME:  27 minutes.  DISPOSITION:  Stable to PACU.  INDICATIONS:  Joy Johns is a 53 year old female who has had pins and needle sensation in the right hand.  This is bothersome to her.  It wakes her at night.  She wishes to have right carpal tunnel release. Risks, benefits, and alternatives of the surgery were discussed including risk of blood loss, infection; damage to nerves, vessels, tendons, ligaments, bone; failure of surgery; need for additional surgery, complications with wound healing, recurrence of carpal tunnel syndrome, and damage to motor branch.  She voiced understanding of these risks and elected to proceed.  OPERATIVE COURSE:  After being identified preoperatively by myself, the patient and I agreed upon procedure and site of procedure.  Surgical site was marked.  Risks, benefits, and alternatives of surgery were reviewed and she wished to proceed.  Surgical consent had been signed. She was given IV Ancef as preoperative antibiotic prophylaxis.  She was transferred to the operating room and placed on the operating room table in supine position with the right upper extremity on arm board.  Bier block anesthesia was induced by anesthesiologist.  The right upper extremity was prepped and draped in normal sterile  orthopedic fashion. Surgical pause was performed between surgeons, anesthesia, operating staff, and all were in agreement as to the patient, procedure, and site of procedure.  Tourniquet at the proximal aspect of the forearm had been inflated for the Bier block.  Incision was made over the transverse carpal ligament.  It was carried down to the subcutaneous tissues by spreading technique.  Bipolar electrocautery was used to obtain hemostasis.  The palmar fascia was sharply incised.  The transverse carpal ligament was identified.  It was sharply incised.  It was incised distally first.  Care was taken to ensure complete decompression distally.  It was incised proximally.  The distal aspect of the volar antebrachial fascia was split with scissors.  A finger was placed into the wound to ensure complete decompression, which was the case.  The nerve was examined.  It was flattened.  The motor branch was identified and was intact.  The wound was copiously irrigated with sterile saline. It was closed with 4-0 nylon in a horizontal mattress fashion.  It was injected with 10 mL of 0.25% plain Marcaine to aid in postoperative analgesia.  The wound was dressed  with sterile Xeroform, 4x4s, and ABD and wrapped with Kerlix and Ace bandage.  Tourniquet was deflated at 27 minutes.  Fingertips were pink with brisk capillary refill after deflation of tourniquet.  Operative drapes were broken down.  The patient was awoken from anesthesia safely.  She was transferred back to stretcher and taken to PACU in stable condition.  I will see her back in the office in 1 week for postoperative followup.  I will give her hydrocodone 5/325, 1-2 p.o. q.6 hours p.r.n. pain, dispensed #30.     Leanora Cover, MD     KK/MEDQ  D:  08/19/2014  T:  08/20/2014  Job:  [id number]

## 2014-09-16 ENCOUNTER — Telehealth: Payer: Self-pay | Admitting: *Deleted

## 2014-09-16 ENCOUNTER — Ambulatory Visit (INDEPENDENT_AMBULATORY_CARE_PROVIDER_SITE_OTHER): Payer: PRIVATE HEALTH INSURANCE

## 2014-09-16 ENCOUNTER — Encounter: Payer: Self-pay | Admitting: Podiatry

## 2014-09-16 ENCOUNTER — Ambulatory Visit (INDEPENDENT_AMBULATORY_CARE_PROVIDER_SITE_OTHER): Payer: PRIVATE HEALTH INSURANCE | Admitting: Podiatry

## 2014-09-16 VITALS — BP 103/67 | HR 65 | Resp 16

## 2014-09-16 DIAGNOSIS — M205X1 Other deformities of toe(s) (acquired), right foot: Secondary | ICD-10-CM

## 2014-09-16 DIAGNOSIS — M2010 Hallux valgus (acquired), unspecified foot: Secondary | ICD-10-CM

## 2014-09-16 NOTE — Progress Notes (Signed)
   Subjective:    Patient ID: Joy Johns, female    DOB: 1961/06/21, 54 y.o.   MRN: [id number]  HPI Comments: "I have bunions"  Patient c/o aching 1st MPJ bilateral for several years. The last year has worsened. Shoes are uncomfortable. She has tried several types of shoe for comfort-no help.     Review of Systems  HENT: Positive for sinus pressure, sneezing and tinnitus.   Musculoskeletal: Positive for arthralgias.  Allergic/Immunologic: Positive for food allergies.  All other systems reviewed and are negative.      Objective:   Physical Exam        Assessment & Plan:

## 2014-09-16 NOTE — Progress Notes (Signed)
Subjective:     Patient ID: Joy Johns, female   DOB: [birth year], 54 y.o.   MRN: [id number]  HPI patient presents stating I have bunions on both my feet with pain in the joint of the right over the left spur formation redness when I wear shoes and it's been getting worse over the last couple years   Review of Systems  All other systems reviewed and are negative.      Objective:   Physical Exam  Constitutional: She is oriented to person, place, and time.  Cardiovascular: Intact distal pulses.   Musculoskeletal: Normal range of motion.  Neurological: She is oriented to person, place, and time.  Skin: Skin is warm.  Nursing note and vitals reviewed.  neurovascular status intact with muscle strength adequate and range of motion subtalar midtarsal joint within normal limits. Patient's noted to have good digital perfusion is well oriented 3 and does have moderate depression of the arch upon weightbearing. Patient has spurring of the first metatarsal right over left with reduced range of motion with approximate 15 of dorsiflexion 10 plantarflexion with minimal crepitus of the joint surface. 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 Discussed at great length the situation and the structural malalignment that the patient has a we discussed conservative and surgical treatments. Patient would like to get this fixed due to the fact it's been present for a number of years and gradually getting worse and I recommended we fixed the right foot first. Today I went ahead and I reviewed with the patient the consent form for a biplanar osteotomy first metatarsal right explaining all alternative treatments and complications that can occur and the fact that ultimately this  may require fusion or joint implantation. Patient wants surgery understanding total recovery period is approximately 6 months and I went ahead and dispensed air fracture walker and scheduled her for procedure in the next several weeks. She will call with any questions prior to procedure and was given all preoperative instructions

## 2014-09-16 NOTE — Telephone Encounter (Signed)
I called and left her a message that we have her scheduled for surgery on 09/29/2014.  Tentatively scheduled with your arrival time at 12:15pm.  They will call you a day or two ahead of time and inform you of the exact time.  Call if you have any questions.

## 2014-09-16 NOTE — Patient Instructions (Addendum)
Bunion (Hallux Valgus) A bony bump (protrusion) on the inside of the foot, at the base of the first toe, is called a bunion (hallux valgus). A bunion causes the first toe to angle toward the other toes. SYMPTOMS   A bony bump on the inside of the foot, causing an outward turning of the first toe. It may also overlap the second toe.  Thickening of the skin (callus) over the bony bump.  Fluid buildup under the callus. Fluid may become red, tender, and swollen (inflamed) with constant irritation or pressure.  Foot pain and stiffness. CAUSES  Many causes exist, including:  Inherited from your family (genetics).  Injury (trauma) forcing the first toe into a position in which it overlaps other toes.  Bunions are also associated with wearing shoes that have a narrow toe box (pointy shoes). RISK INCREASES WITH:  Family history of foot abnormalities, especially bunions.  Arthritis.  Narrow shoes, especially high heels. PREVENTION  Wear shoes with a wide toe box.  Avoid shoes with high heels.  Wear a small pad between the big toe and second toe.  Maintain proper conditioning:  Foot and ankle flexibility.  Muscle strength and endurance. PROGNOSIS  With proper treatment, bunions can typically be cured. Occasionally, surgery is required.  RELATED COMPLICATIONS   Infection of the bunion.  Arthritis of the first toe.  Risks of surgery, including infection, bleeding, injury to nerves (numb toe), recurrent bunion, overcorrection (toe points inward), arthritis of the big toe, big toe pointing upward, and bone not healing. TREATMENT  Treatment first consists of stopping the activities that aggravate the pain, taking pain medicines, and icing to reduce inflammation and pain. Wear shoes with a wide toe box. Shoes can be modified by a shoe repair person to relieve pressure on the bunion, especially if you cannot find shoes with a wide enough toe box. You may also place a pad with the  center cut out in your shoe, to reduce pressure on the bunion. Sometimes, an arch support (orthotic) may reduce pressure on the bunion and alleviate the symptoms. Stretching and strengthening exercises for the muscles of the foot may be useful. You may choose to wear a brace or pad at night to hold the big toe away from the second toe. If non-surgical treatments are not successful, surgery may be needed. Surgery involves removing the overgrown tissue and correcting the position of the first toe, by realigning the bones. Bunion surgery is typically performed on an outpatient basis, meaning you can go home the same day as surgery. The surgery may involve cutting the mid portion of the bone of the first toe, or just cutting and repairing (reconstructing) the ligaments and soft tissues around the first toe.  MEDICATION   If pain medicine is needed, nonsteroidal anti-inflammatory medicines, such as aspirin and ibuprofen, or other minor pain relievers, such as acetaminophen, are often recommended.  Do not take pain medicine for 7 days before surgery.  Prescription pain relievers are usually only prescribed after surgery. Use only as directed and only as much as you need.  Ointments applied to the skin may be helpful. HEAT AND COLD  Cold treatment (icing) relieves pain and reduces inflammation. Cold treatment should be applied for 10 to 15 minutes every 2 to 3 hours for inflammation and pain and immediately after any activity that aggravates your symptoms. Use ice packs or an ice massage.  Heat treatment may be used prior to performing the stretching and strengthening activities prescribed by your   caregiver, physical therapist, or athletic trainer. Use a heat pack or a warm soak. SEEK MEDICAL CARE IF:   Symptoms get worse or do not improve in 2 weeks, despite treatment.  After surgery, you develop fever, increasing pain, redness, swelling, drainage of fluids, bleeding, or increasing warmth around the  surgical area.  New, unexplained symptoms develop. (Drugs used in treatment may produce side effects.) Document Released: 08/20/2005 Document Revised: 11/12/2011 Document Reviewed: 12/02/2008 [facility] Patient Information 2015 [place], [place]. This information is not intended to replace advice given to you by your health care provider. Make sure you discuss any questions you have with your health care provider. Pre-Operative Instructions  Congratulations, you have decided to take an important step to improving your quality of life.  You can be assured that the doctors of [facility] will be with you every step of the way.  Plan to be at the surgery center/hospital at least 1 (one) hour prior to your scheduled time unless otherwise directed by the surgical center/hospital staff.  You must have a responsible adult accompany you, remain during the surgery and drive you home.  Make sure you have directions to the surgical center/hospital and know how to get there on time. For hospital based surgery you will need to obtain a history and physical form from your family physician within 1 month prior to the date of surgery- we will give you a form for you primary physician.  We make every effort to accommodate the date you request for surgery.  There are however, times where surgery dates or times have to be moved.  We will contact you as soon as possible if a change in schedule is required.   No Aspirin/Ibuprofen for one week before surgery.  If you are on aspirin, any non-steroidal anti-inflammatory medications (Mobic, Aleve, Ibuprofen) you should stop taking it 7 days prior to your surgery.  You make take Tylenol  For pain prior to surgery.  Medications- If you are taking daily heart and blood pressure medications, seizure, reflux, allergy, asthma, anxiety, pain or diabetes medications, make sure the surgery center/hospital is aware before the day of surgery so they may notify you which medications to  take or avoid the day of surgery. No food or drink after midnight the night before surgery unless directed otherwise by surgical center/hospital staff. No alcoholic beverages 24 hours prior to surgery.  No smoking 24 hours prior to or 24 hours after surgery. Wear loose pants or shorts- loose enough to fit over bandages, boots, and casts. No slip on shoes, sneakers are best. Bring your boot with you to the surgery center/hospital.  Also bring crutches or a walker if your physician has prescribed it for you.  If you do not have this equipment, it will be provided for you after surgery. If you have not been contracted by the surgery center/hospital by the day before your surgery, call to confirm the date and time of your surgery. Leave-time from work may vary depending on the type of surgery you have.  Appropriate arrangements should be made prior to surgery with your employer. Prescriptions will be provided immediately following surgery by your doctor.  Have these filled as soon as possible after surgery and take the medication as directed. Remove nail polish on the operative foot. Wash the night before surgery.  The night before surgery wash the foot and leg well with the antibacterial soap provided and water paying special attention to beneath the toenails and in between the  toes.  Rinse thoroughly with water and dry well with a towel.  Perform this wash unless told not to do so by your physician.  Enclosed: 1 Ice pack (please put in freezer the night before surgery)   1 Hibiclens skin cleaner   Pre-op Instructions  If you have any questions regarding the instructions, do not hesitate to call our office.  [place]: [place], [place] [address] [place]: [address], [place], [place] [address] [phone]  [place]: [address],  [address] [phone]  Dr. Kendell Bane DPM, Dr. Ila Mcgill DPM Dr. Harriet Masson DPM, Dr. Lanelle Bal DPM, Dr.  Trudie Buckler DPM

## 2014-09-29 ENCOUNTER — Encounter: Payer: Self-pay | Admitting: Podiatry

## 2014-09-29 DIAGNOSIS — M2021 Hallux rigidus, right foot: Secondary | ICD-10-CM

## 2014-09-30 ENCOUNTER — Other Ambulatory Visit: Payer: Self-pay | Admitting: *Deleted

## 2014-09-30 ENCOUNTER — Telehealth: Payer: Self-pay | Admitting: *Deleted

## 2014-09-30 MED ORDER — HYDROCODONE-ACETAMINOPHEN 10-325 MG PO TABS: 1.0000 | ORAL_TABLET | Freq: Four times a day (QID) | ORAL | Status: DC | PRN

## 2014-09-30 NOTE — Telephone Encounter (Signed)
Joy Johns called left message. Per dr Paulla Dolly pt is to have vicodin 10-325 #40 one by mouth every 4 - 6 hrs as needed for pain. Per valery pts husband will be by the [place] office within the next hour to pick up prescription.

## 2014-09-30 NOTE — Telephone Encounter (Addendum)
Pt states she had surgery 09/29/2014 by Dr. Paulla Dolly.  Pt states she loosened the ace wrap due to the numbness of the right 1, 2nd toes and saw some dried and some red blood area the size of a nickel and the toes have slow capillary refill.  Pt states she is on Sertraline and is concerned with Seratonin Syndrome, she asked if Dr. Paulla Dolly could change Demerol to Hydrocodone.  She is currently managing her pain with Hydrocodone from hand surgery in December 2015, but needs refill.  I informed pt the nickel-size area of red blood is not unusual, may have occurred due to disruption of the clot when removing the ace wrap, to mark the area and call if becomes larger than a $0.50 piece, to dangle the surgery foot 10 - 15 minutes then place level for a few minutes, may do this periodically as needed, and begin icing and elevation as intructed.  Dr. Paulla Dolly ordered discontinue the Demerol, Hydrocodone 10/325 #40 1 tablet every 4 - 6 hours prn pain.

## 2014-10-06 ENCOUNTER — Encounter: Payer: Self-pay | Admitting: Podiatry

## 2014-10-06 ENCOUNTER — Ambulatory Visit (INDEPENDENT_AMBULATORY_CARE_PROVIDER_SITE_OTHER): Payer: PRIVATE HEALTH INSURANCE

## 2014-10-06 ENCOUNTER — Ambulatory Visit (INDEPENDENT_AMBULATORY_CARE_PROVIDER_SITE_OTHER): Payer: PRIVATE HEALTH INSURANCE | Admitting: Podiatry

## 2014-10-06 VITALS — BP 128/61 | HR 72 | Resp 16

## 2014-10-06 DIAGNOSIS — M2011 Hallux valgus (acquired), right foot: Secondary | ICD-10-CM

## 2014-10-06 NOTE — Progress Notes (Signed)
Subjective:     Patient ID: Joy Johns, female   DOB: [birth year], 54 y.o.   MRN: [id number]  HPI patient states I'm doing well with my right foot with mild pain and swelling if I been on it too long   Review of Systems     Objective:   Physical Exam Neurovascular status intact with no change in health history noted and well-healing surgical site first metatarsal right with wound edges well coapted and approximate 30 of dorsiflexion 20 of plantarflexion    Assessment:     Doing well post hallux limitus repair right    Plan:     Reviewed condition and recommended physical therapy and dispensed a ankle brace [employer] with a plantarflexed hallux wrap. Also placed and surgical shoe and instructed on range of motion exercises gradual return soft shoe gear and reappoint in 4 weeks. Reviewed x-rays with patient

## 2014-10-18 NOTE — Progress Notes (Signed)
Dr Paulla Dolly performed a right Austin bunionectomy on 1.27.16

## 2014-11-03 ENCOUNTER — Ambulatory Visit (INDEPENDENT_AMBULATORY_CARE_PROVIDER_SITE_OTHER): Payer: PRIVATE HEALTH INSURANCE

## 2014-11-03 ENCOUNTER — Encounter: Payer: Self-pay | Admitting: Podiatry

## 2014-11-03 ENCOUNTER — Ambulatory Visit (INDEPENDENT_AMBULATORY_CARE_PROVIDER_SITE_OTHER): Payer: PRIVATE HEALTH INSURANCE | Admitting: Podiatry

## 2014-11-03 VITALS — BP 102/63 | HR 74 | Resp 12

## 2014-11-03 DIAGNOSIS — Z9889 Other specified postprocedural states: Secondary | ICD-10-CM

## 2014-11-03 DIAGNOSIS — M205X1 Other deformities of toe(s) (acquired), right foot: Secondary | ICD-10-CM

## 2014-11-04 NOTE — Progress Notes (Signed)
Subjective:     Patient ID: Joy Johns, female   DOB: 02/02/1961, 54 y.o.   MRN: [id number]  HPI patient states it's doing quite a bit better with discomfort still noted if I do a lot of walking but I'm very happy with the motion   Review of Systems     Objective:   Physical Exam Neurovascular status intact with negative Homans sign noted and well coapted first metatarsal left incision site with excellent range of motion of approximate 30 dorsiflexion 20 plantar flexion    Assessment:     Hallux limitus rigidus condition left that responded very well to osteotomy surgery    Plan:     Reviewed x-rays and at this time advised the patient on continued range of motion exercises and increase in activity levels

## 2014-11-23 ENCOUNTER — Encounter: Payer: Self-pay | Admitting: Podiatry

## 2014-11-23 ENCOUNTER — Ambulatory Visit (INDEPENDENT_AMBULATORY_CARE_PROVIDER_SITE_OTHER): Payer: PRIVATE HEALTH INSURANCE

## 2014-11-23 ENCOUNTER — Ambulatory Visit (INDEPENDENT_AMBULATORY_CARE_PROVIDER_SITE_OTHER): Payer: PRIVATE HEALTH INSURANCE | Admitting: Podiatry

## 2014-11-23 VITALS — BP 114/74 | HR 77 | Resp 18

## 2014-11-23 DIAGNOSIS — M779 Enthesopathy, unspecified: Secondary | ICD-10-CM | POA: Diagnosis not present

## 2014-11-23 DIAGNOSIS — G8918 Other acute postprocedural pain: Secondary | ICD-10-CM | POA: Diagnosis not present

## 2014-11-23 MED ORDER — TRIAMCINOLONE ACETONIDE 10 MG/ML IJ SUSP
10.0000 mg | Freq: Once | INTRAMUSCULAR | Status: AC
  Administered 2014-11-23: 10 mg

## 2014-11-23 NOTE — Progress Notes (Signed)
Subjective:     Patient ID: Joy Johns, female   DOB: 11-17-1960, 54 y.o.   MRN: [id number]  HPI patient states I started to get pain around my second MPJ right of my foot that was operated on with the big toe joint doing wonderful and feeling great   Review of Systems     Objective:   Physical Exam Neurovascular status intact muscle strength adequate with range of motion within normal limits. Patient's noted to have inflammation second MPJ right that's painful when pressed and makes walking difficult. The first MPJ is doing very well with excellent dorsi and plantarflexion    Assessment:     Inflammatory capsulitis second MPJ right with good structural movement of the first MPJ    Plan:     X-ray reviewed and did a proximal nerve block right aspirated the joint getting out a small amount of clear fluid and injected with half cc of dexamethasone and applied thick padding to reduce pressure on the joint. Reappoint to recheck in several weeks

## 2014-11-23 NOTE — Progress Notes (Signed)
   Subjective:    Patient ID: Joy Johns, female    DOB: Oct 28, 1960, 54 y.o.   MRN: [id number]  HPI    Review of Systems     Objective:   Physical Exam        Assessment & Plan:

## 2014-12-01 ENCOUNTER — Ambulatory Visit (INDEPENDENT_AMBULATORY_CARE_PROVIDER_SITE_OTHER): Payer: PRIVATE HEALTH INSURANCE | Admitting: Podiatry

## 2014-12-01 VITALS — BP 113/64 | HR 80 | Resp 16

## 2014-12-01 DIAGNOSIS — M779 Enthesopathy, unspecified: Secondary | ICD-10-CM

## 2014-12-01 DIAGNOSIS — M2041 Other hammer toe(s) (acquired), right foot: Secondary | ICD-10-CM

## 2014-12-02 ENCOUNTER — Ambulatory Visit: Payer: PRIVATE HEALTH INSURANCE | Admitting: Podiatry

## 2014-12-02 NOTE — Progress Notes (Signed)
Subjective:     Patient ID: Joy Johns, female   DOB: 1960-10-09, 54 y.o.   MRN: [id number]  HPI patient states that it has improved some but it is still quite sore in the second joint with movement of my toe that occurred several months ago   Review of Systems     Objective:   Physical Exam Neurovascular status intact muscle strength adequate with continued discomfort second MPJ right and fluid buildup noted around the joint    Assessment:     Inflammatory capsulitis second MPJ right    Plan:     Reviewed condition and at this point recommended orthotics to distribute weight off this area and try to prevent her from needing future surgery. Scanned for custom orthotic devices

## 2014-12-15 ENCOUNTER — Ambulatory Visit (INDEPENDENT_AMBULATORY_CARE_PROVIDER_SITE_OTHER): Payer: PRIVATE HEALTH INSURANCE | Admitting: Podiatry

## 2014-12-15 ENCOUNTER — Ambulatory Visit (INDEPENDENT_AMBULATORY_CARE_PROVIDER_SITE_OTHER): Payer: PRIVATE HEALTH INSURANCE

## 2014-12-15 DIAGNOSIS — Z9889 Other specified postprocedural states: Secondary | ICD-10-CM

## 2014-12-15 DIAGNOSIS — M205X1 Other deformities of toe(s) (acquired), right foot: Secondary | ICD-10-CM

## 2014-12-15 NOTE — Progress Notes (Signed)
Subjective:     Patient ID: Joy Johns, female   DOB: 11-20-1960, 54 y.o.   MRN: [id number]  HPI patient presents stating her foot is feeling quite a bit better with still some inflammation noted   Review of Systems     Objective:   Physical Exam Neurovascular status intact incision site right first metatarsal healed with excellent range of motion of the first MPJ and no crepitus    Assessment:     Doing well post osteotomy right first metatarsal with inflammation second MPJ which is improving    Plan:

## 2014-12-15 NOTE — Patient Instructions (Signed)

## 2015-02-16 ENCOUNTER — Ambulatory Visit: Payer: PRIVATE HEALTH INSURANCE | Admitting: Podiatry

## 2015-03-08 ENCOUNTER — Other Ambulatory Visit: Payer: Self-pay | Admitting: Family Medicine

## 2015-03-08 DIAGNOSIS — F32A Depression, unspecified: Secondary | ICD-10-CM

## 2015-03-08 DIAGNOSIS — F329 Major depressive disorder, single episode, unspecified: Secondary | ICD-10-CM | POA: Insufficient documentation

## 2015-06-20 ENCOUNTER — Telehealth: Payer: Self-pay | Admitting: *Deleted

## 2015-06-20 NOTE — Telephone Encounter (Signed)
"  I'm a patient of Dr. Paulla Dolly.  I seen him earlier in the year and had one foot operated on.  He told me to let him know when I was ready for the next one.  I would like to schedule surgery for later this year.  I want to see what's available and what I do next.  My work number is [phone].

## 2015-06-20 NOTE — Telephone Encounter (Signed)
I'm returning your call.  You will need to schedule an appointment with Dr. Paulla Dolly for a consultation.Burnis Medin get you scheduled for surgery when you come in.   Would you like me to transfer you to a scheduler?  "That will be great."

## 2015-06-28 ENCOUNTER — Ambulatory Visit (INDEPENDENT_AMBULATORY_CARE_PROVIDER_SITE_OTHER): Payer: PRIVATE HEALTH INSURANCE

## 2015-06-28 ENCOUNTER — Ambulatory Visit (INDEPENDENT_AMBULATORY_CARE_PROVIDER_SITE_OTHER): Payer: PRIVATE HEALTH INSURANCE | Admitting: Podiatry

## 2015-06-28 ENCOUNTER — Encounter: Payer: Self-pay | Admitting: Podiatry

## 2015-06-28 VITALS — BP 104/61 | HR 74 | Resp 16

## 2015-06-28 DIAGNOSIS — M205X2 Other deformities of toe(s) (acquired), left foot: Secondary | ICD-10-CM

## 2015-06-28 DIAGNOSIS — M2011 Hallux valgus (acquired), right foot: Secondary | ICD-10-CM

## 2015-06-28 DIAGNOSIS — M79672 Pain in left foot: Secondary | ICD-10-CM

## 2015-06-29 NOTE — Progress Notes (Signed)
Subjective:     Patient ID: Joy Johns, female   DOB: 03-18-1961, 54 y.o.   MRN: [id number]  HPI patient states I'm ready to get this left foot fixed and I want to get it done before the end of the year. My right foot is doing well   Review of Systems     Objective:   Physical Exam Her vascular status intact muscle strength adequate with hyperostosis and redness around the medial side of the left first metatarsal and moderate restriction of motion with well structured surgical site right first metatarsal from previous surgery    Assessment:     Structural HAV hallux limitus deformity noted left with mild elongation of the metatarsal bone and well-healing surgical site right    Plan:     Reviewed x-ray of left foot and discussed removal of spur reduction of IM angle and shortening osteotomy procedure. I did explain if there's any cartilage damage we will not be able to correct this and I allowed patient to read consent form reviewing alternative treatments and complications. Patient is scheduled for outpatient surgery at this time and signs consent form and is given all instructions to review. Understands recovery can take up to 6 months to one year

## 2015-08-30 ENCOUNTER — Encounter: Payer: Self-pay | Admitting: Podiatry

## 2015-08-30 DIAGNOSIS — M2012 Hallux valgus (acquired), left foot: Secondary | ICD-10-CM | POA: Diagnosis not present

## 2015-09-04 HISTORY — PX: BUNIONECTOMY: SHX129

## 2015-09-07 ENCOUNTER — Other Ambulatory Visit: Payer: Self-pay | Admitting: Podiatry

## 2015-09-07 ENCOUNTER — Encounter: Payer: Self-pay | Admitting: Podiatry

## 2015-09-07 ENCOUNTER — Ambulatory Visit (INDEPENDENT_AMBULATORY_CARE_PROVIDER_SITE_OTHER): Payer: PRIVATE HEALTH INSURANCE

## 2015-09-07 ENCOUNTER — Ambulatory Visit (INDEPENDENT_AMBULATORY_CARE_PROVIDER_SITE_OTHER): Payer: PRIVATE HEALTH INSURANCE | Admitting: Podiatry

## 2015-09-07 VITALS — BP 77/47 | HR 69 | Temp 97.2°F | Resp 16

## 2015-09-07 DIAGNOSIS — M2011 Hallux valgus (acquired), right foot: Secondary | ICD-10-CM

## 2015-09-07 DIAGNOSIS — Z9889 Other specified postprocedural states: Secondary | ICD-10-CM

## 2015-09-07 DIAGNOSIS — M2041 Other hammer toe(s) (acquired), right foot: Secondary | ICD-10-CM

## 2015-09-07 NOTE — Progress Notes (Signed)
Subjective:     Patient ID: Joy Johns, female   DOB: Sep 05, 1960, 55 y.o.   MRN: [id number]  HPI patient states that she's doing very well with minimal pain or swelling   Review of Systems     Objective:   Physical Exam Neurovascular status intact negative Homans sign noted well coapted incision site metatarsal with wound edges healing well and good alignment with good range of motion dorsi and plantarflexion    Assessment:     Doing well post Austin osteotomy left and biplanar    Plan:     Reviewed x-rays and instructed on physical therapy elevation immobilization and range of motion. Reappoint to recheck in 3 weeks

## 2015-09-09 ENCOUNTER — Other Ambulatory Visit: Payer: Self-pay

## 2015-09-29 ENCOUNTER — Encounter: Payer: Self-pay | Admitting: Podiatry

## 2015-09-29 ENCOUNTER — Ambulatory Visit (INDEPENDENT_AMBULATORY_CARE_PROVIDER_SITE_OTHER): Payer: PRIVATE HEALTH INSURANCE

## 2015-09-29 ENCOUNTER — Ambulatory Visit (INDEPENDENT_AMBULATORY_CARE_PROVIDER_SITE_OTHER): Payer: PRIVATE HEALTH INSURANCE | Admitting: Podiatry

## 2015-09-29 DIAGNOSIS — M2011 Hallux valgus (acquired), right foot: Secondary | ICD-10-CM

## 2015-09-29 DIAGNOSIS — Z9889 Other specified postprocedural states: Secondary | ICD-10-CM

## 2015-09-29 DIAGNOSIS — M205X2 Other deformities of toe(s) (acquired), left foot: Secondary | ICD-10-CM

## 2015-09-29 NOTE — Progress Notes (Signed)
Subjective:     Patient ID: Joy Johns, female   DOB: [birth year], 55 y.o.   MRN: [id number]  HPI patient states that overall she is doing well with her left foot with diminished discomfort and good range of motion   Review of Systems     Objective:   Physical Exam Neurovascular status intact muscle strength adequate range of motion within normal limits with patient found to have good wound edges that are coapted well first MPJ left    Assessment:     Doing well post osteotomy first metatarsal left    Plan:     Reviewed condition and x-rays and allow patient to return to soft shoes and dispensed anklet with instructions on usage along with continued elevation and reappoint 4 weeks or earlier if needed

## 2015-11-10 ENCOUNTER — Ambulatory Visit (INDEPENDENT_AMBULATORY_CARE_PROVIDER_SITE_OTHER): Payer: PRIVATE HEALTH INSURANCE | Admitting: Podiatry

## 2015-11-10 ENCOUNTER — Encounter: Payer: Self-pay | Admitting: Podiatry

## 2015-11-10 ENCOUNTER — Ambulatory Visit (INDEPENDENT_AMBULATORY_CARE_PROVIDER_SITE_OTHER): Payer: PRIVATE HEALTH INSURANCE

## 2015-11-10 DIAGNOSIS — M2010 Hallux valgus (acquired), unspecified foot: Secondary | ICD-10-CM

## 2015-11-10 DIAGNOSIS — Z9889 Other specified postprocedural states: Secondary | ICD-10-CM

## 2015-11-10 DIAGNOSIS — M205X2 Other deformities of toe(s) (acquired), left foot: Secondary | ICD-10-CM | POA: Diagnosis not present

## 2015-11-10 NOTE — Progress Notes (Signed)
Subjective:     Patient ID: Joy Johns, female   DOB: 10/10/1960, 55 y.o.   MRN: [id number]  HPI patient presents stating I'm doing really well with minimal discomfort   Review of Systems     Objective:   Physical Exam Neurovascular status intact muscle strength adequate with excellent range of motion first MPJ left with no crepitus of the joint    Assessment:     Doing well post osteotomy left first metatarsal with good range of motion and no crepitus    Plan:     X-ray report discussed with patient and allow patient to return to all normal activity and will be seen back as needed  X-ray report indicated the joint is wide open with good function pins are in place and no spurring is noted

## 2016-03-10 ENCOUNTER — Other Ambulatory Visit: Payer: Self-pay | Admitting: Family Medicine

## 2016-03-10 DIAGNOSIS — F329 Major depressive disorder, single episode, unspecified: Secondary | ICD-10-CM

## 2016-03-10 DIAGNOSIS — F32A Depression, unspecified: Secondary | ICD-10-CM

## 2016-04-06 ENCOUNTER — Other Ambulatory Visit: Payer: Self-pay | Admitting: Family Medicine

## 2016-04-06 DIAGNOSIS — F329 Major depressive disorder, single episode, unspecified: Secondary | ICD-10-CM

## 2016-04-06 DIAGNOSIS — F32A Depression, unspecified: Secondary | ICD-10-CM

## 2016-04-27 NOTE — Progress Notes (Signed)
DOS 08/30/2015 Bi Plantar austin (cutting and removing bone) w/pair fixation left foot.

## 2016-06-06 ENCOUNTER — Ambulatory Visit (INDEPENDENT_AMBULATORY_CARE_PROVIDER_SITE_OTHER): Payer: PRIVATE HEALTH INSURANCE

## 2016-06-06 ENCOUNTER — Ambulatory Visit (INDEPENDENT_AMBULATORY_CARE_PROVIDER_SITE_OTHER): Payer: PRIVATE HEALTH INSURANCE | Admitting: Podiatry

## 2016-06-06 ENCOUNTER — Encounter: Payer: Self-pay | Admitting: Podiatry

## 2016-06-06 VITALS — BP 119/66 | HR 73 | Resp 16

## 2016-06-06 DIAGNOSIS — M779 Enthesopathy, unspecified: Secondary | ICD-10-CM

## 2016-06-06 DIAGNOSIS — M205X2 Other deformities of toe(s) (acquired), left foot: Secondary | ICD-10-CM

## 2016-06-06 DIAGNOSIS — M79672 Pain in left foot: Secondary | ICD-10-CM | POA: Diagnosis not present

## 2016-06-06 MED ORDER — TRIAMCINOLONE ACETONIDE 10 MG/ML IJ SUSP
10.0000 mg | Freq: Once | INTRAMUSCULAR | Status: AC
  Administered 2016-06-06: 10 mg

## 2016-06-06 NOTE — Progress Notes (Signed)
Subjective:     Patient ID: Joy Johns, female   DOB: 1961-04-08, 55 y.o.   MRN: [id number]  HPI patient presents stating that she's getting a lot of pain in her left big toe joint and she was concerned about this and overall she's done well with surgery but having pain   Review of Systems     Objective:   Physical Exam Neurovascular status intact muscle strength adequate with patient found to have discomfort in the lateral side the left first MPJ with fluid buildup around the area with no restriction of motion with mild crepitus upon dorsiflexing the big toe    Assessment:     Inflammatory changes first MPJ left with capsular like inflammation and arthritis of the lateral side of the joint that probably involves cartilage damage with the overall surgery having been successful to regain the motion of the joint    Plan:     Explained conditions and at this time I did a careful capsular injection lateral 3 mg Kenalog 5 mg Xylocaine and then scanned for an orthotic to offload the first metatarsal. Patient will be seen back when ready or earlier if any issues should occur  X-ray report indicates there is compression on the lateral side of the joint with probable small joint space damage

## 2016-06-27 ENCOUNTER — Ambulatory Visit: Payer: PRIVATE HEALTH INSURANCE

## 2016-06-27 DIAGNOSIS — M779 Enthesopathy, unspecified: Secondary | ICD-10-CM

## 2016-06-27 NOTE — Patient Instructions (Signed)

## 2016-06-27 NOTE — Progress Notes (Signed)
Orthotics dispensed with written instructions and verbal instructions. Patient is to follow up as needed

## 2016-08-28 ENCOUNTER — Other Ambulatory Visit: Payer: Self-pay | Admitting: Family Medicine

## 2016-08-28 DIAGNOSIS — F329 Major depressive disorder, single episode, unspecified: Secondary | ICD-10-CM

## 2016-08-28 DIAGNOSIS — F32A Depression, unspecified: Secondary | ICD-10-CM

## 2016-10-01 ENCOUNTER — Other Ambulatory Visit: Payer: Self-pay | Admitting: Family Medicine

## 2016-10-01 DIAGNOSIS — F32A Depression, unspecified: Secondary | ICD-10-CM

## 2016-10-01 DIAGNOSIS — F329 Major depressive disorder, single episode, unspecified: Secondary | ICD-10-CM

## 2016-10-29 ENCOUNTER — Other Ambulatory Visit: Payer: Self-pay | Admitting: Family Medicine

## 2016-10-29 DIAGNOSIS — F329 Major depressive disorder, single episode, unspecified: Secondary | ICD-10-CM

## 2016-10-29 DIAGNOSIS — F32A Depression, unspecified: Secondary | ICD-10-CM

## 2016-11-19 ENCOUNTER — Other Ambulatory Visit: Payer: Self-pay | Admitting: Family Medicine

## 2016-11-19 DIAGNOSIS — F329 Major depressive disorder, single episode, unspecified: Secondary | ICD-10-CM

## 2016-11-19 DIAGNOSIS — F32A Depression, unspecified: Secondary | ICD-10-CM

## 2016-12-21 ENCOUNTER — Ambulatory Visit (INDEPENDENT_AMBULATORY_CARE_PROVIDER_SITE_OTHER): Payer: PRIVATE HEALTH INSURANCE | Admitting: Podiatry

## 2016-12-21 ENCOUNTER — Encounter: Payer: Self-pay | Admitting: Podiatry

## 2016-12-21 DIAGNOSIS — M779 Enthesopathy, unspecified: Secondary | ICD-10-CM | POA: Diagnosis not present

## 2016-12-21 DIAGNOSIS — M79672 Pain in left foot: Secondary | ICD-10-CM

## 2016-12-21 MED ORDER — TRIAMCINOLONE ACETONIDE 10 MG/ML IJ SUSP
10.0000 mg | Freq: Once | INTRAMUSCULAR | Status: AC
  Administered 2016-12-21: 10 mg

## 2016-12-21 NOTE — Progress Notes (Signed)
Subjective:     Patient ID: Joy Johns, female   DOB: 11-Aug-1961, 56 y.o.   MRN: [id number]  HPI patient presents stating I'm having pain again in my left foot and I had relief for 5 months   Review of Systems     Objective:   Physical Exam Neurovascular status found to be intact with patient noted to have discomfort in the second metatarsophalangeal joint of the left foot    Assessment:     Inflammatory capsulitis second MPJ left with big toe joint functioning well with mild crepitus at forced dorsiflexion    Plan:     Proximal nerve block administered sterile application to the area and I then aspirated the second MPJ getting out of small amount of clear fluid and injected with a quarter cc deck some Kenalog and dispensed padding to reduce pressure against the joint surface. May need to modify her orthotics or change orthotics depending on response

## 2017-01-11 ENCOUNTER — Ambulatory Visit (INDEPENDENT_AMBULATORY_CARE_PROVIDER_SITE_OTHER): Payer: PRIVATE HEALTH INSURANCE

## 2017-01-11 ENCOUNTER — Ambulatory Visit (INDEPENDENT_AMBULATORY_CARE_PROVIDER_SITE_OTHER): Payer: PRIVATE HEALTH INSURANCE | Admitting: Podiatry

## 2017-01-11 DIAGNOSIS — M205X2 Other deformities of toe(s) (acquired), left foot: Secondary | ICD-10-CM

## 2017-01-11 DIAGNOSIS — Z9889 Other specified postprocedural states: Secondary | ICD-10-CM

## 2017-01-11 DIAGNOSIS — M779 Enthesopathy, unspecified: Secondary | ICD-10-CM

## 2017-01-11 DIAGNOSIS — M2010 Hallux valgus (acquired), unspecified foot: Secondary | ICD-10-CM

## 2017-01-13 NOTE — Progress Notes (Signed)
Subjective:    Patient ID: Joy Johns, female   DOB: 56 y.o.   MRN: [id number]   HPI patient states that the metatarsal pad helped her quite a bit with her discomfort and if she doesn't wear she still gets pain in her left foot    ROS      Objective:  Physical Exam Neurovascular status intact with mild to moderate continued inflammation around the second MPJ left which intensifies without the pad usage    Assessment:    Inflammatory capsulitis second MPJ left with pain     Plan:     H&P condition reviewed and at this point I recommended long-term orthotics to provide for better stability. Patient will see rectus I want the second metatarsal left off loaded and she does have old orthotics which we'll probably need to be rehabilitated

## 2017-01-21 ENCOUNTER — Ambulatory Visit (INDEPENDENT_AMBULATORY_CARE_PROVIDER_SITE_OTHER): Payer: PRIVATE HEALTH INSURANCE | Admitting: Podiatry

## 2017-01-21 DIAGNOSIS — M778 Other enthesopathies, not elsewhere classified: Secondary | ICD-10-CM

## 2017-01-21 DIAGNOSIS — M7752 Other enthesopathy of left foot: Secondary | ICD-10-CM | POA: Diagnosis not present

## 2017-01-21 DIAGNOSIS — M779 Enthesopathy, unspecified: Principal | ICD-10-CM

## 2017-01-21 NOTE — Progress Notes (Signed)
Patient comes in today for casting for FO..The patient has a history of capsulitis 2nd sub met left....cast her in foam with offload indicated w/ betadine.   Shell to be made of subortholene...ppt padding and plasitzote top cover....met pads bilat

## 2017-02-12 ENCOUNTER — Encounter: Payer: PRIVATE HEALTH INSURANCE | Admitting: Orthotics

## 2017-02-27 ENCOUNTER — Other Ambulatory Visit: Payer: Self-pay | Admitting: Family Medicine

## 2017-02-27 DIAGNOSIS — F32A Depression, unspecified: Secondary | ICD-10-CM

## 2017-02-27 DIAGNOSIS — F329 Major depressive disorder, single episode, unspecified: Secondary | ICD-10-CM

## 2017-02-28 NOTE — Telephone Encounter (Signed)
Please review for Dr Vincente Liberty

## 2017-04-05 ENCOUNTER — Other Ambulatory Visit: Payer: Self-pay | Admitting: Family Medicine

## 2017-04-05 DIAGNOSIS — F32A Depression, unspecified: Secondary | ICD-10-CM

## 2017-04-05 DIAGNOSIS — F329 Major depressive disorder, single episode, unspecified: Secondary | ICD-10-CM

## 2017-05-01 ENCOUNTER — Other Ambulatory Visit: Payer: Self-pay | Admitting: Family Medicine

## 2017-05-01 DIAGNOSIS — F32A Depression, unspecified: Secondary | ICD-10-CM

## 2017-05-01 DIAGNOSIS — F329 Major depressive disorder, single episode, unspecified: Secondary | ICD-10-CM

## 2017-05-20 ENCOUNTER — Encounter (HOSPITAL_BASED_OUTPATIENT_CLINIC_OR_DEPARTMENT_OTHER): Payer: Self-pay

## 2017-05-20 ENCOUNTER — Emergency Department (HOSPITAL_BASED_OUTPATIENT_CLINIC_OR_DEPARTMENT_OTHER)
Admission: EM | Admit: 2017-05-20 | Discharge: 2017-05-20 | Disposition: A | Discharge status: Home / Self Care | Payer: PRIVATE HEALTH INSURANCE | Attending: Emergency Medicine | Admitting: Emergency Medicine

## 2017-05-20 ENCOUNTER — Emergency Department (HOSPITAL_BASED_OUTPATIENT_CLINIC_OR_DEPARTMENT_OTHER): Payer: PRIVATE HEALTH INSURANCE

## 2017-05-20 DIAGNOSIS — Z91040 Latex allergy status: Secondary | ICD-10-CM | POA: Diagnosis not present

## 2017-05-20 DIAGNOSIS — R0789 Other chest pain: Secondary | ICD-10-CM | POA: Diagnosis not present

## 2017-05-20 DIAGNOSIS — R079 Chest pain, unspecified: Secondary | ICD-10-CM | POA: Diagnosis present

## 2017-05-20 DIAGNOSIS — Z79899 Other long term (current) drug therapy: Secondary | ICD-10-CM | POA: Insufficient documentation

## 2017-05-20 HISTORY — DX: Pure hypercholesterolemia, unspecified: E78.00

## 2017-05-20 LAB — BASIC METABOLIC PANEL
Anion gap: 6 (ref 5–15)
BUN: 13 mg/dL (ref 6–20)
CHLORIDE: 106 mmol/L (ref 101–111)
CO2: 27 mmol/L (ref 22–32)
CREATININE: 0.8 mg/dL (ref 0.44–1.00)
Calcium: 8.9 mg/dL (ref 8.9–10.3)
Glucose, Bld: 95 mg/dL (ref 65–99)
POTASSIUM: 4.2 mmol/L (ref 3.5–5.1)
SODIUM: 139 mmol/L (ref 135–145)

## 2017-05-20 LAB — CBC
HEMATOCRIT: 37.9 % (ref 36.0–46.0)
Hemoglobin: 12.5 g/dL (ref 12.0–15.0)
MCH: 29.8 pg (ref 26.0–34.0)
MCHC: 33 g/dL (ref 30.0–36.0)
MCV: 90.5 fL (ref 78.0–100.0)
PLATELETS: 197 10*3/uL (ref 150–400)
RBC: 4.19 MIL/uL (ref 3.87–5.11)
RDW: 13.1 % (ref 11.5–15.5)
WBC: 4.7 10*3/uL (ref 4.0–10.5)

## 2017-05-20 LAB — TROPONIN I: Troponin I: <0.03 ng/mL (ref <0.03)

## 2017-05-20 NOTE — Discharge Instructions (Signed)
Use Tylenol or ibuprofen as needed for pain. Follow-up with your primary care doctor in 1 week if her symptoms are not improving. Return to the emergency room if your pain becomes more persistent, you have associated shortness of breath or vomiting, or you have any new or worsening symptoms. Return if you have pain or swelling in your legs.

## 2017-05-20 NOTE — ED Notes (Signed)
Patient transported to X-ray 

## 2017-05-20 NOTE — ED Notes (Signed)
ED Provider at bedside. 

## 2017-05-20 NOTE — ED Provider Notes (Signed)
[facility] Provider Note   CSN: [id number] Arrival date & time: 05/20/17  1124     History   Chief Complaint Chief Complaint  Patient presents with  . Chest Pain    HPI Joy Johns is a 56 y.o. female presenting with 2 days of intermittent chest pain.  Patient states she was at work when she first felt the chest pain. 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 Chest pain has been intermittent over the past several days, lasting 5-10 minutes. It is sharp and on the left side of her chest. She cannot reproduce the pain. It is relieved spontaneously. It has not occurred while she is at rest. Pain today started at 8:00 this morning. It has been persistent, although has been improving. She denies associated shortness of breath, nausea, vomiting, or diaphoresis. She denies cardiac or pulmonary history. She does not smoke. She denies history of hypertension diabetes. Family history includes 2 brothers with MI in their 31s. She denies leg pain or swelling. She does not use estrogen, she does not have a history of cancer, she's never had a blood clot in the past. She denies fever, chills, sore throat, cough, abdominal pain, urinary symptoms, abnormal bowel movements.   HPI  Past Medical History:  Diagnosis Date  . Carpal tunnel syndrome   . DDD (degenerative disc disease)   . High cholesterol     Patient Active Problem List   Diagnosis Date Noted  . Depression 03/08/2015    Past Surgical History:  Procedure Laterality Date  . CARPAL TUNNEL RELEASE Left 04/22/2014   Procedure: LEFT CARPAL TUNNEL RELEASE;  Surgeon: Leanora Cover, MD;  Location: [facility];  Service: Orthopedics;  Laterality: Left;  . CARPAL TUNNEL RELEASE Right 08/19/2014   Procedure: RIGHT CARPAL TUNNEL RELEASE;  Surgeon: Leanora Cover, MD;  Location: [facility];  Service: Orthopedics;  Laterality: Right;  . CERVICAL FUSION  1999  . COLONOSCOPY    . FINGER  ARTHROSCOPY WITH CARPOMETACARPEL (CMC) ARTHROPLASTY     both thumbs  . TONSILLECTOMY      OB History    No data available       Home Medications    Prior to Admission medications   Medication Sig Start Date End Date Taking? Authorizing Provider  calcium carbonate (OS-CAL) 600 MG TABS tablet Take 600 mg by mouth 2 (two) times daily with a meal.    [provider]  Multiple Vitamins-Minerals (MULTIVITAMIN WITH MINERALS) tablet Take 1 tablet by mouth daily.    [provider]  sertraline (ZOLOFT) 50 MG tablet TAKE 1 TABLET BY MOUTH EVERY DAY 05/02/17   Carmon Ginsberg, PA    Family History No family history on file.  Social History Social History  Substance Use Topics  . Smoking status: Never Smoker  . Smokeless tobacco: Never Used  . Alcohol use Yes     Comment: occ     Allergies   Latex and Shrimp [shellfish allergy]   Review of Systems Review of Systems  Cardiovascular: Positive for chest pain.  All other systems reviewed and are negative.    Physical Exam Updated Vital Signs BP 121/86 (BP Location: Right Arm)   Pulse 64   Temp 97.8 F (36.6 C) (Oral)   Resp 14   Ht 5\' 2"  (1.575 m)   Wt 65.5 kg (144 lb 6.4 oz)   SpO2 100%   BMI 26.41 kg/m   Physical Exam  Constitutional: She is oriented to person, place, and time. She appears well-developed and well-nourished. No distress.  HENT:  Head: Normocephalic and atraumatic.  Eyes: EOM are normal.  Neck: Normal range of motion.  Cardiovascular: Normal rate, regular rhythm and intact distal pulses.   Pulmonary/Chest: Effort normal and breath sounds normal. No respiratory distress. She has no decreased breath sounds. She has no wheezes. She has no rhonchi. She has no rales.  Good air movement in all lung fields. No adventitious sounds. No tenderness to palpation of chest wall.  Abdominal: Soft. Bowel sounds are normal. She exhibits no distension. There is no tenderness. There is no rigidity, no  rebound, no guarding and no CVA tenderness.  Musculoskeletal: Normal range of motion. She exhibits no edema or tenderness.  Moves all extremities without difficulty. Radial and pedal pulses intact bilaterally. No swelling or pain of calves bilaterally.  Neurological: She is alert and oriented to person, place, and time.  Skin: Skin is warm and dry.  Psychiatric: She has a normal mood and affect.  Nursing note and vitals reviewed.    ED Treatments / Results  Labs (all labs ordered are listed, but only abnormal results are displayed) Labs Reviewed  BASIC METABOLIC PANEL  CBC  TROPONIN I  TROPONIN I    EKG  EKG Interpretation  Date/Time:  Monday May 20 2017 11:31:46 EDT Ventricular Rate:  67 PR Interval:    QRS Duration: 87 QT Interval:  394 QTC Calculation: 416 R Axis:   55 Text Interpretation:  Sinus rhythm Low voltage, precordial leads No acute changes No significant change since last tracing Confirmed by Varney Biles ([id number]) on 05/20/2017 1:09:26 PM       Radiology Dg Chest 2 View  Result Date: 05/20/2017 CLINICAL DATA:  Chest pain. EXAM: CHEST  2 VIEW COMPARISON:  None. FINDINGS: The heart size and mediastinal contours are within normal limits. Both lungs are clear. The visualized skeletal structures are unremarkable. IMPRESSION: Normal exam. Electronically Signed   By: Lorriane Shire M.D.   On: 05/20/2017 11:55    Procedures Procedures (including critical care time)  Medications Ordered in ED Medications - No data to display   Initial Impression / Assessment and Plan / ED Course  I have reviewed the triage vital signs and the nursing notes.  Pertinent labs & imaging results that were available during my care of the patient were reviewed by me and considered in my medical decision making (see chart for details).     Patient presented with several days of intermittent chest pain, and persistent chest pain today. Initial cardiac workup shows patient with  a negative troponin, and reassuring basic labs. EKG unchanged from last tracing. Chest x-ray negative for infiltrate. Patient with a wells score for PE of 0. Heart score of 2, indicating low risk. Will order repeat troponin.   Repeat troponin negative. Discussed case with attending, Dr. Kathrynn Humble agrees to plan. Will have patient follow-up with primary care if symptoms are persisting. Otherwise will use Tylenol or ibuprofen as needed for pain. Strict return precautions given. At this time, patient appears safe for discharge. Patient states she understands and agrees to plan.  Final Clinical Impressions(s) / ED Diagnoses   Final diagnoses:  Atypical chest pain    New Prescriptions Discharge Medication List as of 05/20/2017  3:24 PM       Franchot Heidelberg, PA-C 05/20/17 [place], [place], MD 05/21/17 [phone]

## 2017-05-20 NOTE — ED Triage Notes (Signed)
C/o CP x 2 days-NAD-steady gait 

## 2017-06-09 ENCOUNTER — Other Ambulatory Visit: Payer: Self-pay | Admitting: Family Medicine

## 2017-06-09 DIAGNOSIS — F32A Depression, unspecified: Secondary | ICD-10-CM

## 2017-06-09 DIAGNOSIS — F329 Major depressive disorder, single episode, unspecified: Secondary | ICD-10-CM

## 2017-07-01 ENCOUNTER — Other Ambulatory Visit: Payer: Self-pay | Admitting: Family Medicine

## 2017-07-01 DIAGNOSIS — F32A Depression, unspecified: Secondary | ICD-10-CM

## 2017-07-01 DIAGNOSIS — F329 Major depressive disorder, single episode, unspecified: Secondary | ICD-10-CM

## 2017-07-01 MED ORDER — SERTRALINE HCL 50 MG PO TABS: 50.0000 mg | ORAL_TABLET | Freq: Every day | ORAL | 0 refills | Status: DC

## 2017-07-26 ENCOUNTER — Other Ambulatory Visit: Payer: Self-pay | Admitting: Family Medicine

## 2017-07-26 DIAGNOSIS — F329 Major depressive disorder, single episode, unspecified: Secondary | ICD-10-CM

## 2017-07-26 DIAGNOSIS — F32A Depression, unspecified: Secondary | ICD-10-CM

## 2017-08-30 ENCOUNTER — Other Ambulatory Visit: Payer: Self-pay | Admitting: Family Medicine

## 2017-08-30 DIAGNOSIS — F329 Major depressive disorder, single episode, unspecified: Secondary | ICD-10-CM

## 2017-08-30 DIAGNOSIS — F32A Depression, unspecified: Secondary | ICD-10-CM

## 2017-09-04 ENCOUNTER — Other Ambulatory Visit: Payer: Self-pay | Admitting: Family Medicine

## 2017-09-04 DIAGNOSIS — F329 Major depressive disorder, single episode, unspecified: Secondary | ICD-10-CM

## 2017-09-04 DIAGNOSIS — F32A Depression, unspecified: Secondary | ICD-10-CM

## 2017-11-08 ENCOUNTER — Other Ambulatory Visit: Payer: Self-pay | Admitting: Family Medicine

## 2017-11-08 ENCOUNTER — Ambulatory Visit
Admission: RE | Admit: 2017-11-08 | Discharge: 2017-11-08 | Disposition: A | Discharge status: Home / Self Care | Payer: PRIVATE HEALTH INSURANCE | Source: Ambulatory Visit | Attending: Family Medicine | Admitting: Family Medicine

## 2017-11-08 DIAGNOSIS — Z1231 Encounter for screening mammogram for malignant neoplasm of breast: Secondary | ICD-10-CM

## 2017-11-12 ENCOUNTER — Other Ambulatory Visit: Payer: Self-pay | Admitting: Family Medicine

## 2017-11-12 DIAGNOSIS — R928 Other abnormal and inconclusive findings on diagnostic imaging of breast: Secondary | ICD-10-CM

## 2017-11-14 ENCOUNTER — Ambulatory Visit
Admission: RE | Admit: 2017-11-14 | Discharge: 2017-11-14 | Disposition: A | Discharge status: Home / Self Care | Payer: PRIVATE HEALTH INSURANCE | Source: Ambulatory Visit | Attending: Family Medicine | Admitting: Family Medicine

## 2017-11-14 ENCOUNTER — Ambulatory Visit: Payer: PRIVATE HEALTH INSURANCE

## 2017-11-14 DIAGNOSIS — R928 Other abnormal and inconclusive findings on diagnostic imaging of breast: Secondary | ICD-10-CM

## 2018-06-09 ENCOUNTER — Ambulatory Visit (INDEPENDENT_AMBULATORY_CARE_PROVIDER_SITE_OTHER): Payer: PRIVATE HEALTH INSURANCE

## 2018-06-09 ENCOUNTER — Ambulatory Visit: Payer: PRIVATE HEALTH INSURANCE | Admitting: Podiatry

## 2018-06-09 ENCOUNTER — Encounter: Payer: Self-pay | Admitting: Podiatry

## 2018-06-09 ENCOUNTER — Other Ambulatory Visit: Payer: Self-pay | Admitting: Podiatry

## 2018-06-09 DIAGNOSIS — M779 Enthesopathy, unspecified: Secondary | ICD-10-CM

## 2018-06-09 DIAGNOSIS — S99922A Unspecified injury of left foot, initial encounter: Secondary | ICD-10-CM

## 2018-06-11 NOTE — Progress Notes (Signed)
Subjective:   Patient ID: Joy Johns, female   DOB: 57 y.o.   MRN: [id number]   HPI Patient presents with discomfort in the outside of the left foot and states is been increasingly painful for her and she did have an injury several days ago where she turned her foot   ROS      Objective:  Physical Exam  Neurovascular status intact with acute inflammation lateral side left foot with pain upon palpation to the fifth metatarsal base with patient wearing a surgical shoe currently     Assessment:  Possibility for fracture of the base the fifth metatarsal left secondary to inversion sprain injury     Plan:  H&P condition reviewed and at this point reviewed a fracture which I do think will heal uneventfully.  Patient has a boot at home and will wear this on utilize aggressive compression and anti-inflammatories along with ice and will be seen back by [place] if symptoms were to persist over the next 4 to 6 weeks.  It should gradually get better over that period of time  X-ray indicates that there is a fracture of the base of fifth metatarsal left that is nondisplaced and does not appear to be Jones in its orientation

## 2018-07-18 ENCOUNTER — Other Ambulatory Visit (HOSPITAL_COMMUNITY)
Admission: RE | Admit: 2018-07-18 | Discharge: 2018-07-18 | Disposition: A | Discharge status: Home / Self Care | Payer: PRIVATE HEALTH INSURANCE | Source: Ambulatory Visit | Attending: Obstetrics and Gynecology | Admitting: Obstetrics and Gynecology

## 2018-07-18 ENCOUNTER — Other Ambulatory Visit: Payer: Self-pay | Admitting: Obstetrics and Gynecology

## 2018-07-18 DIAGNOSIS — Z01411 Encounter for gynecological examination (general) (routine) with abnormal findings: Secondary | ICD-10-CM | POA: Diagnosis present

## 2018-07-23 LAB — CYTOLOGY - PAP
Diagnosis: NEGATIVE
HPV: NOT DETECTED

## 2018-09-03 DIAGNOSIS — C801 Malignant (primary) neoplasm, unspecified: Secondary | ICD-10-CM

## 2018-09-03 HISTORY — DX: Malignant (primary) neoplasm, unspecified: C80.1

## 2019-07-21 ENCOUNTER — Other Ambulatory Visit: Payer: Self-pay | Admitting: Family Medicine

## 2019-07-21 DIAGNOSIS — Z1231 Encounter for screening mammogram for malignant neoplasm of breast: Secondary | ICD-10-CM

## 2019-07-22 ENCOUNTER — Ambulatory Visit
Admission: RE | Admit: 2019-07-22 | Discharge: 2019-07-22 | Disposition: A | Discharge status: Home / Self Care | Payer: PRIVATE HEALTH INSURANCE | Source: Ambulatory Visit | Attending: Family Medicine | Admitting: Family Medicine

## 2019-07-22 ENCOUNTER — Other Ambulatory Visit: Payer: Self-pay

## 2019-07-22 DIAGNOSIS — Z1231 Encounter for screening mammogram for malignant neoplasm of breast: Secondary | ICD-10-CM

## 2020-06-21 ENCOUNTER — Other Ambulatory Visit: Payer: Self-pay | Admitting: Family Medicine

## 2020-06-21 DIAGNOSIS — Z1231 Encounter for screening mammogram for malignant neoplasm of breast: Secondary | ICD-10-CM

## 2020-07-22 ENCOUNTER — Other Ambulatory Visit: Payer: Self-pay

## 2020-07-22 ENCOUNTER — Ambulatory Visit
Admission: RE | Admit: 2020-07-22 | Discharge: 2020-07-22 | Disposition: A | Discharge status: Home / Self Care | Payer: PRIVATE HEALTH INSURANCE | Source: Ambulatory Visit | Attending: Family Medicine | Admitting: Family Medicine

## 2020-07-22 DIAGNOSIS — Z1231 Encounter for screening mammogram for malignant neoplasm of breast: Secondary | ICD-10-CM

## 2021-01-09 ENCOUNTER — Encounter: Payer: Self-pay | Admitting: Cardiology

## 2021-01-09 ENCOUNTER — Other Ambulatory Visit: Payer: Self-pay

## 2021-01-09 ENCOUNTER — Ambulatory Visit: Payer: PRIVATE HEALTH INSURANCE | Admitting: Cardiology

## 2021-01-09 VITALS — BP 120/80 | HR 64 | Ht 61.5 in | Wt 123.2 lb

## 2021-01-09 DIAGNOSIS — Z8249 Family history of ischemic heart disease and other diseases of the circulatory system: Secondary | ICD-10-CM | POA: Diagnosis not present

## 2021-01-09 DIAGNOSIS — M79601 Pain in right arm: Secondary | ICD-10-CM | POA: Diagnosis not present

## 2021-01-09 DIAGNOSIS — R079 Chest pain, unspecified: Secondary | ICD-10-CM | POA: Diagnosis not present

## 2021-01-09 DIAGNOSIS — E7800 Pure hypercholesterolemia, unspecified: Secondary | ICD-10-CM

## 2021-01-09 DIAGNOSIS — Z7189 Other specified counseling: Secondary | ICD-10-CM | POA: Diagnosis not present

## 2021-01-09 DIAGNOSIS — M79602 Pain in left arm: Secondary | ICD-10-CM

## 2021-01-09 MED ORDER — METOPROLOL TARTRATE 25 MG PO TABS: ORAL_TABLET | ORAL | 0 refills | Status: DC

## 2021-01-09 NOTE — Assessment & Plan Note (Signed)
Brings log of chart from 2010. Range of Tchol from 222-271, TG always normal, HDL 85-103, LDL 123-159. Never been on medications.

## 2021-01-09 NOTE — Assessment & Plan Note (Signed)
Most recent episode of bilateral arm pain was associated with chest tightness. Has risk factors of family history of heart disease, history of high cholesterol.  We spent significant time today reviewing different parts of the cardiovascular system (electrical, vascular, functional, and valvular). We discussed how each of these systems can present with different symptoms. We reviewed that there are different ways we evaluate these symptoms with tests. We reviewed which tests I think are most appropriate given the symptoms, and we discussed risks/benefits and limitations of each of these tests. Please see summary below. We also discussed that if testing is unrevealing for a cardiac cause of the symptoms, there are many noncardiac causes as well that can contribute to symptoms. If the heart is ruled out, then I recommend returning to PCP to discuss alternative diagnoses.  -discussed treadmill stress, nuclear stress/lexiscan, and CT coronary angiography. Discussed pros and cons of each, including but not limited to false positive/false negative risk, radiation risk, and risk of IV contrast dye. Based on shared decision making, decision was made to pursue CT coronary angiography. -will give one time dose of metoprolol 2 hours prior to scheduled test -counseled on need to get BMET prior to test -counseled on use of sublingual nitroglycerin and its importance to a good test

## 2021-01-09 NOTE — Assessment & Plan Note (Signed)
4 episodes, about 2-4 minutes. Heaviness, most recent episode with tightness across all of chest, generalized weakness.No other associated symptoms. Two nonexertional, most recent two episodes while walking (store, post office). No prodrome, no clear alleviating factors.

## 2021-01-09 NOTE — Progress Notes (Addendum)
Cardiology Office Note:    Date:  01/09/2021   ID:  Joy Johns, DOB 01-Apr-1961, MRN [id number]  PCP:  Kathyrn Lass, MD  Cardiologist:  Buford Dresser, MD  Referring MD: Kathyrn Lass, MD   CC: new patient consultation for arm/chest pain  History of Present Illness:    Joy Johns is a 60 y.o. female with a hx of generalized anxiety disorder, family history of heart disease who is seen as a new consult at the request of Kathyrn Lass, MD for the evaluation and management of upper left arm pain.  Cardiovascular risk factors: Prior clinical ASCVD: none Comorbid conditions, including hypertension, hyperlipidemia, diabetes, chronic kidney disease: hyperlipidemia  Metabolic syndrome/Obesity: highest adult weight 140 Chronic inflammatory conditions: none Tobacco use history: Never smoker. Family history: Mother had stent in her mid-70's, hypertension, hyperlipemia, overweight. Father's hx unknown. Oldest brother had cardiomyopathy, severe heart failure possibly due blockages and alcohol/diet, kidney damage. Second Brother had MI, LAD, stent, drinks alcohol, active/not overweight. Third brother had MI, LAD, stent.   Prior cardiac testing and/or incidental findings on other testing (ie coronary calcium): One EKG Exercise level: Able to climb stairs, lift grandchild, Walked 3-4 miles per day last summer, no exertional issues Current diet: Oatmeal every morning, Salads at [employer], occasionally skips supper, limits fast food about once a month, 2 mugs of coffee each morning, mostly water and sometimes soft drink.  Today: She recently has had 4 unusual episodes of bilateral weakness in her arms. They usually last 2-4 minutes and occur randomly. She describes the episodes as a "heavy" feeling and the most recent episode included tightness/pressure across her central chest. At onset of her second episode, she was driving out of a car wash and walked across the parking lot to [employer]. At  this time she felt like she could not carry her purse. Third episode occurred in a department store and fourth episode in a post office. She was not lifting anything or exerting herself prior to these episodes. They have occurred without warning and dissipate quickly. Of note, she states that she bruises easily. She denies any chest pain, shortness of breath, or palpitations. No pre-syncope, syncope, or lightheadedness/dizziness to note. Also has no lower extremity edema, orthopnea or PND.  Past Medical History:  Diagnosis Date  . Carpal tunnel syndrome   . DDD (degenerative disc disease)   . High cholesterol     Past Surgical History:  Procedure Laterality Date  . CARPAL TUNNEL RELEASE Left 04/22/2014   Procedure: LEFT CARPAL TUNNEL RELEASE;  Surgeon: Leanora Cover, MD;  Location: [facility];  Service: Orthopedics;  Laterality: Left;  . CARPAL TUNNEL RELEASE Right 08/19/2014   Procedure: RIGHT CARPAL TUNNEL RELEASE;  Surgeon: Leanora Cover, MD;  Location: [facility];  Service: Orthopedics;  Laterality: Right;  . CERVICAL FUSION  1999  . COLONOSCOPY    . FINGER ARTHROSCOPY WITH CARPOMETACARPEL (CMC) ARTHROPLASTY     both thumbs  . TONSILLECTOMY      Current Medications: Current Outpatient Medications on File Prior to Visit  Medication Sig  . calcium carbonate (OS-CAL) 600 MG TABS tablet Take 600 mg by mouth 2 (two) times daily with a meal.  . loratadine (CLARITIN) 10 MG tablet Take 10 mg by mouth daily.  . sertraline (ZOLOFT) 50 MG tablet TAKE 1 TABLET BY MOUTH DAILY (Patient taking differently: 50 mg 2 (two) times daily.)   No current facility-administered medications on file prior to visit.  Allergies:   Latex and Shrimp [shellfish allergy]   Social History   Tobacco Use  . Smoking status: Never Smoker  . Smokeless tobacco: Never Used  Substance Use Topics  . Alcohol use: Yes    Comment: occ  . Drug use: No    Family History: As noted in  HPI, significant cardiovascular family history  ROS:   Please see the history of present illness.  Additional pertinent ROS: Constitutional: Negative for chills, fever, night sweats, unintentional weight loss  HENT: Negative for ear pain and hearing loss.   Eyes: Negative for loss of vision and eye pain.  Respiratory: Negative for cough, sputum, wheezing.   Cardiovascular: See HPI. Gastrointestinal: Negative for abdominal pain, melena, and hematochezia.  Genitourinary: Negative for dysuria and hematuria.  Musculoskeletal: Positive for bilateral arm weakness, chest pressure. Negative for falls and myalgias.  Skin: Negative for itching and rash.  Neurological: Negative for focal weakness, focal sensory changes and loss of consciousness.  Endo/Heme/Allergies: Does bruise/bleed easily.     EKGs/Labs/Other Studies Reviewed:    The following studies were reviewed today: No prior cardiac studies  EKG:    01/09/2021: EKG is not ordered today. 12/02/20: (outside ECG) normal sinus rhythm with nonspecific T wave inversion in aVL  Recent Labs: No results found for requested labs within last 8760 hours.  Recent Lipid Panel No results found for: CHOL, TRIG, HDL, CHOLHDL, VLDL, LDLCALC, LDLDIRECT  Physical Exam:    VS:  BP 120/80 (BP Location: Left Arm, Patient Position: Sitting, Cuff Size: Normal)   Pulse 64   Ht 5' 1.5" (1.562 m)   Wt 123 lb 3.2 oz (55.9 kg)   SpO2 99%   BMI 22.90 kg/m     Wt Readings from Last 3 Encounters:  01/09/21 123 lb 3.2 oz (55.9 kg)  05/20/17 144 lb 6.4 oz (65.5 kg)  08/19/14 131 lb (59.4 kg)    GEN: Well nourished, well developed in no acute distress HEENT: Normal, moist mucous membranes NECK: No JVD CARDIAC: regular rhythm, normal S1 and S2, no rubs or gallops. No murmur. VASCULAR: Radial and DP pulses 2+ bilaterally. No carotid bruits RESPIRATORY:  Clear to auscultation without rales, wheezing or rhonchi  ABDOMEN: Soft, non-tender,  non-distended MUSCULOSKELETAL:  Ambulates independently SKIN: Warm and dry, no edema NEUROLOGIC:  Alert and oriented x 3. No focal neuro deficits noted. PSYCHIATRIC:  Normal affect    ASSESSMENT:    1. Chest pain of uncertain etiology   2. Bilateral arm pain   3. Cardiac risk counseling   4. Family history of heart disease   5. Pure hypercholesterolemia   6. Counseling on health promotion and disease prevention    PLAN:    Bilateral arm pain 4 episodes, about 2-4 minutes. Heaviness, most recent episode with tightness across all of chest, generalized weakness.No other associated symptoms. Two nonexertional, most recent two episodes while walking (store, post office). No prodrome, no clear alleviating factors.  Family history of heart disease See history above. Given elevated cholesterol and family history, as well as symptoms, proceed with evaluation as noted  Chest pain of uncertain etiology Most recent episode of bilateral arm pain was associated with chest tightness. Has risk factors of family history of heart disease, history of high cholesterol.  We spent significant time today reviewing different parts of the cardiovascular system (electrical, vascular, functional, and valvular). We discussed how each of these systems can present with different symptoms. We reviewed that there are different ways we evaluate these  symptoms with tests. We reviewed which tests I think are most appropriate given the symptoms, and we discussed risks/benefits and limitations of each of these tests. Please see summary below. We also discussed that if testing is unrevealing for a cardiac cause of the symptoms, there are many noncardiac causes as well that can contribute to symptoms. If the heart is ruled out, then I recommend returning to PCP to discuss alternative diagnoses.  -discussed treadmill stress, nuclear stress/lexiscan, and CT coronary angiography. Discussed pros and cons of each, including but not  limited to false positive/false negative risk, radiation risk, and risk of IV contrast dye. Based on shared decision making, decision was made to pursue CT coronary angiography. -will give one time dose of metoprolol 2 hours prior to scheduled test -counseled on need to get BMET prior to test -counseled on use of sublingual nitroglycerin and its importance to a good test  Pure hypercholesterolemia Brings log of chart from 2010. Range of Tchol from 222-271, TG always normal, HDL 85-103, LDL 123-159. Never been on medications.  Cardiac risk counseling and prevention recommendations: -recommend heart healthy/Mediterranean diet, with whole grains, fruits, vegetable, fish, lean meats, nuts, and olive oil. Limit salt. -recommend moderate walking, 3-5 times/week for 30-50 minutes each session. Aim for at least 150 minutes.week. Goal should be pace of 3 miles/hours, or walking 1.5 miles in 30 minutes -recommend avoidance of tobacco products. Avoid excess alcohol.    Plan for follow up: 2 years or sooner if testing abnormal  Buford Dresser, MD, PhD, [facility]    Medication Adjustments/Labs and Tests Ordered: Current medicines are reviewed at length with the patient today.  Concerns regarding medicines are outlined above.  Orders Placed This Encounter  Procedures  . CT CORONARY MORPH W/CTA COR W/SCORE W/CA W/CM &/OR WO/CM  . Basic Metabolic Panel (BMET)   Meds ordered this encounter  Medications  . metoprolol tartrate (LOPRESSOR) 25 MG tablet    Sig: Take 2 hours prior to CT scan    Dispense:  1 tablet    Refill:  0    Patient Instructions    Testing/Procedures:  Your cardiac CT will be scheduled at one of the below locations:   [facility] [address] [place], [place] [address] [phone]   If scheduled at [facility], please arrive at the [facility] main entrance (entrance A) of [facility] 30 minutes prior to  test start time. Proceed to the [facility] Radiology Department (first floor) to check-in and test prep.    Please follow these instructions carefully (unless otherwise directed):   On the Night Before the Test: . Be sure to Drink plenty of water. . Do not consume any caffeinated/decaffeinated beverages or chocolate 12 hours prior to your test. . Do not take any antihistamines 12 hours prior to your test.   On the Day of the Test: . Drink plenty of water until 1 hour prior to the test. . Do not eat any food 4 hours prior to the test. . You may take your regular medications prior to the test.  . Take metoprolol (Lopressor) 25 mg two hours prior to test. . HOLD Furosemide/Hydrochlorothiazide morning of the test. . FEMALES- please wear underwire-free bra if available         After the Test: . Drink plenty of water. . After receiving IV contrast, you may experience a mild flushed feeling. This is normal. . On occasion, you may experience a  mild rash up to 24 hours after the test. This is not dangerous. If this occurs, you can take Benadryl 25 mg and increase your fluid intake. . If you experience trouble breathing, this can be serious. If it is severe call 911 IMMEDIATELY. If it is mild, please call our office. . If you take any of these medications: Glipizide/Metformin, Avandament, Glucavance, please do not take 48 hours after completing test unless otherwise instructed.   Once we have confirmed authorization from your insurance company, we will call you to set up a date and time for your test. Based on how quickly your insurance processes prior authorizations requests, please allow up to 4 weeks to be contacted for scheduling your Cardiac CT appointment. Be advised that routine Cardiac CT appointments could be scheduled as many as 8 weeks after your provider has ordered it.  For non-scheduling related questions, please contact the cardiac imaging nurse navigator should you have any  questions/concerns: Marchia Bond, Cardiac Imaging Nurse Navigator Gordy Clement, Cardiac Imaging Nurse Navigator [facility] Direct Office Dial: [phone]   For scheduling needs, including cancellations and rescheduling, please call [place], [phone].     Follow-Up: At [facility], you and your health needs are our priority.  As part of our continuing mission to provide you with exceptional heart care, we have created designated Provider Care Teams.  These Care Teams include your primary Cardiologist (physician) and Advanced Practice Providers (APPs -  Physician Assistants and Nurse Practitioners) who all work together to provide you with the care you need, when you need it.  We recommend signing up for the patient portal called "MyChart".  Sign up information is provided on this After Visit Summary.  MyChart is used to connect with patients for Virtual Visits (Telemedicine).  Patients are able to view lab/test results, encounter notes, upcoming appointments, etc.  Non-urgent messages can be sent to your provider as well.   To learn more about what you can do with MyChart, go to [email or web address].    Your next appointment:   2 year(s)  The format for your next appointment:   In Person  Provider:   Buford Dresser, MD      Signed, Buford Dresser, MD PhD 01/09/2021 12:16 PM    [place]  I,Mathew Stumpf,acting as a scribe for Buford Dresser, MD.,have documented all relevant documentation on the behalf of Buford Dresser, MD,as directed by  Buford Dresser, MD while in the presence of Buford Dresser, MD.  I, Buford Dresser, MD, have reviewed all documentation for this visit. The documentation on 01/09/21 for the exam, diagnosis, procedures, and orders are all accurate and complete.

## 2021-01-09 NOTE — Assessment & Plan Note (Signed)
See history above. Given elevated cholesterol and family history, as well as symptoms, proceed with evaluation as noted

## 2021-01-09 NOTE — Patient Instructions (Signed)
Testing/Procedures:  Your cardiac CT will be scheduled at one of the below locations:   [facility] [address] [place], [place] [address] [phone]   If scheduled at [facility], please arrive at the [facility] main entrance (entrance A) of [facility] 30 minutes prior to test start time. Proceed to the [facility] Radiology Department (first floor) to check-in and test prep.    Please follow these instructions carefully (unless otherwise directed):   On the Night Before the Test: . Be sure to Drink plenty of water. . Do not consume any caffeinated/decaffeinated beverages or chocolate 12 hours prior to your test. . Do not take any antihistamines 12 hours prior to your test.   On the Day of the Test: . Drink plenty of water until 1 hour prior to the test. . Do not eat any food 4 hours prior to the test. . You may take your regular medications prior to the test.  . Take metoprolol (Lopressor) 25 mg two hours prior to test. . HOLD Furosemide/Hydrochlorothiazide morning of the test. . FEMALES- please wear underwire-free bra if available         After the Test: . Drink plenty of water. . After receiving IV contrast, you may experience a mild flushed feeling. This is normal. . On occasion, you may experience a mild rash up to 24 hours after the test. This is not dangerous. If this occurs, you can take Benadryl 25 mg and increase your fluid intake. . If you experience trouble breathing, this can be serious. If it is severe call 911 IMMEDIATELY. If it is mild, please call our office. . If you take any of these medications: Glipizide/Metformin, Avandament, Glucavance, please do not take 48 hours after completing test unless otherwise instructed.   Once we have confirmed authorization from your insurance company, we will call you to set up a date and time for your test. Based on how quickly your insurance processes prior authorizations requests,  please allow up to 4 weeks to be contacted for scheduling your Cardiac CT appointment. Be advised that routine Cardiac CT appointments could be scheduled as many as 8 weeks after your provider has ordered it.  For non-scheduling related questions, please contact the cardiac imaging nurse navigator should you have any questions/concerns: Marchia Bond, Cardiac Imaging Nurse Navigator Gordy Clement, Cardiac Imaging Nurse Navigator [facility] Direct Office Dial: [phone]   For scheduling needs, including cancellations and rescheduling, please call [place], [phone].     Follow-Up: At [facility], you and your health needs are our priority.  As part of our continuing mission to provide you with exceptional heart care, we have created designated Provider Care Teams.  These Care Teams include your primary Cardiologist (physician) and Advanced Practice Providers (APPs -  Physician Assistants and Nurse Practitioners) who all work together to provide you with the care you need, when you need it.  We recommend signing up for the patient portal called "MyChart".  Sign up information is provided on this After Visit Summary.  MyChart is used to connect with patients for Virtual Visits (Telemedicine).  Patients are able to view lab/test results, encounter notes, upcoming appointments, etc.  Non-urgent messages can be sent to your provider as well.   To learn more about what you can do with MyChart, go to [email or web address].    Your next appointment:   2 year(s)  The format for your next appointment:   In  Person  Provider:   Buford Dresser, MD

## 2021-01-10 ENCOUNTER — Other Ambulatory Visit: Payer: Self-pay | Admitting: Cardiology

## 2021-05-17 LAB — BASIC METABOLIC PANEL
BUN/Creatinine Ratio: 12 (ref 12–28)
BUN: 9 mg/dL (ref 8–27)
CO2: 25 mmol/L (ref 20–29)
Calcium: 9.3 mg/dL (ref 8.7–10.3)
Chloride: 100 mmol/L (ref 96–106)
Creatinine, Ser: 0.74 mg/dL (ref 0.57–1.00)
Glucose: 87 mg/dL (ref 65–99)
Potassium: 5 mmol/L (ref 3.5–5.2)
Sodium: 137 mmol/L (ref 134–144)
eGFR: 93 mL/min/{1.73_m2} (ref >59)

## 2021-05-23 ENCOUNTER — Telehealth (HOSPITAL_COMMUNITY): Payer: Self-pay | Admitting: Emergency Medicine

## 2021-05-23 NOTE — Telephone Encounter (Signed)
Reaching out to patient to offer assistance regarding upcoming cardiac imaging study; pt verbalizes understanding of appt date/time, parking situation and where to check in, pre-test NPO status and medications ordered, and verified current allergies; name and call back number provided for further questions should they arise Marchia Bond RN Navigator Cardiac Imaging [facility] [phone] office [phone] cell  25mg  metoprolol tart R arm best for IV Hold claritin

## 2021-05-24 ENCOUNTER — Other Ambulatory Visit: Payer: Self-pay

## 2021-05-24 ENCOUNTER — Ambulatory Visit (HOSPITAL_COMMUNITY)
Admission: RE | Admit: 2021-05-24 | Discharge: 2021-05-24 | Disposition: A | Discharge status: Home / Self Care | Payer: No Typology Code available for payment source | Source: Ambulatory Visit | Attending: Cardiology | Admitting: Cardiology

## 2021-05-24 ENCOUNTER — Encounter: Payer: No Typology Code available for payment source | Admitting: *Deleted

## 2021-05-24 DIAGNOSIS — R079 Chest pain, unspecified: Secondary | ICD-10-CM

## 2021-05-24 DIAGNOSIS — Z789 Other specified health status: Secondary | ICD-10-CM | POA: Diagnosis present

## 2021-05-24 DIAGNOSIS — Z8249 Family history of ischemic heart disease and other diseases of the circulatory system: Secondary | ICD-10-CM | POA: Insufficient documentation

## 2021-05-24 DIAGNOSIS — M79602 Pain in left arm: Secondary | ICD-10-CM | POA: Diagnosis present

## 2021-05-24 DIAGNOSIS — M79601 Pain in right arm: Secondary | ICD-10-CM | POA: Insufficient documentation

## 2021-05-24 DIAGNOSIS — Z006 Encounter for examination for normal comparison and control in clinical research program: Secondary | ICD-10-CM

## 2021-05-24 MED ORDER — NITROGLYCERIN 0.4 MG SL SUBL
SUBLINGUAL_TABLET | SUBLINGUAL | Status: AC
  Filled 2021-05-24: qty 1

## 2021-05-24 MED ORDER — NITROGLYCERIN 0.4 MG SL SUBL
0.4000 mg | SUBLINGUAL_TABLET | SUBLINGUAL | Status: DC | PRN
  Administered 2021-05-24: 0.4 mg via SUBLINGUAL

## 2021-05-24 MED ORDER — IOHEXOL 350 MG/ML SOLN
80.0000 mL | Freq: Once | INTRAVENOUS | Status: AC | PRN
  Administered 2021-05-24: 80 mL via INTRAVENOUS

## 2021-05-24 NOTE — Research (Signed)
Identify Informed Consent   Subject Name: Joy Johns  Subject met inclusion and exclusion criteria.  The informed consent form, study requirements and expectations were reviewed with the subject and questions and concerns were addressed prior to the signing of the consent form.  The subject verbalized understanding of the trial requirements.  The subject agreed to participate in the Identify trial and signed the informed consent at 0915 on 24-May-2021.  The informed consent was obtained prior to performance of any protocol-specific procedures for the subject.  A copy of the signed informed consent was given to the subject and a copy was placed in the subject's medical record.   Ayodeji Keimig, RN BSN [facility] Direct Line: [phone]    

## 2021-05-24 NOTE — Progress Notes (Signed)
Identify Informed Consent   Subject Name: Joy Johns  Subject met inclusion and exclusion criteria.  The informed consent form, study requirements and expectations were reviewed with the subject and questions and concerns were addressed prior to the signing of the consent form.  The subject verbalized understanding of the trial requirements.  The subject agreed to participate in the Identify trial and signed the informed consent at [place] on 24-May-2021.  The informed consent was obtained prior to performance of any protocol-specific procedures for the subject.  A copy of the signed informed consent was given to the subject and a copy was placed in the subject's medical record.   Jasmine Pang, RN BSN [facility] Direct Line: [phone]

## 2021-06-30 ENCOUNTER — Other Ambulatory Visit: Payer: Self-pay | Admitting: Family Medicine

## 2021-06-30 DIAGNOSIS — Z1231 Encounter for screening mammogram for malignant neoplasm of breast: Secondary | ICD-10-CM

## 2021-08-03 ENCOUNTER — Ambulatory Visit
Admission: RE | Admit: 2021-08-03 | Discharge: 2021-08-03 | Disposition: A | Discharge status: Home / Self Care | Payer: No Typology Code available for payment source | Source: Ambulatory Visit | Attending: Family Medicine | Admitting: Family Medicine

## 2021-08-03 DIAGNOSIS — Z1231 Encounter for screening mammogram for malignant neoplasm of breast: Secondary | ICD-10-CM

## 2021-12-22 ENCOUNTER — Encounter: Payer: Self-pay | Admitting: Internal Medicine

## 2022-01-02 ENCOUNTER — Ambulatory Visit: Payer: No Typology Code available for payment source | Admitting: *Deleted

## 2022-01-02 VITALS — Ht 62.0 in | Wt 138.0 lb

## 2022-01-02 DIAGNOSIS — Z1211 Encounter for screening for malignant neoplasm of colon: Secondary | ICD-10-CM

## 2022-01-02 NOTE — Progress Notes (Signed)
No egg or soy allergy known to patient  ?No issues known to pt with past sedation with any surgeries or procedures ?Patient denies ever being told they had issues or difficulty with intubation  ?No FH of Malignant Hyperthermia ?Pt is not on diet pills ?Pt is not on  home 02  ?Pt is not on blood thinners  ?Pt denies issues with constipation  ?No A fib or A flutter ? ?PV completed over the phone. Pt verified name, DOB, address and insurance during PV today.  ? ?Pt encouraged to call with questions or issues.  ?If pt has My chart, procedure instructions sent via My Chart  ?Insurance confirmed with pt at [facility] today   ?

## 2022-01-25 ENCOUNTER — Encounter: Payer: Self-pay | Admitting: Internal Medicine

## 2022-01-30 ENCOUNTER — Ambulatory Visit (AMBULATORY_SURGERY_CENTER): Payer: No Typology Code available for payment source | Admitting: Internal Medicine

## 2022-01-30 ENCOUNTER — Encounter: Payer: Self-pay | Admitting: Internal Medicine

## 2022-01-30 DIAGNOSIS — Z1211 Encounter for screening for malignant neoplasm of colon: Secondary | ICD-10-CM | POA: Diagnosis present

## 2022-01-30 MED ORDER — SODIUM CHLORIDE 0.9 % IV SOLN
500.0000 mL | INTRAVENOUS | Status: DC
Start: 2022-01-30 — End: 2022-01-30

## 2022-01-30 NOTE — Progress Notes (Signed)
Pt's states no medical or surgical changes since previsit or office visit. 

## 2022-01-30 NOTE — Patient Instructions (Addendum)
No polyps or cancer seen. You do have hypertrophied anal papillae - thickened tissue at the anus. Totally benign.  Next routine colonoscopy or other screening test in 10 years - 2033.   I appreciate the opportunity to care for you. Gatha Mayer, MD, [facility]  Read all of the handouts given to you by your recovery room nurse.  YOU HAD AN ENDOSCOPIC PROCEDURE TODAY AT [facility]:   Refer to the procedure report that was given to you for any specific questions about what was found during the examination.  If the procedure report does not answer your questions, please call your gastroenterologist to clarify.  If you requested that your care partner not be given the details of your procedure findings, then the procedure report has been included in a sealed envelope for you to review at your convenience later.  YOU SHOULD EXPECT: Some feelings of bloating in the abdomen. Passage of more gas than usual.  Walking can help get rid of the air that was put into your GI tract during the procedure and reduce the bloating. If you had a lower endoscopy (such as a colonoscopy or flexible sigmoidoscopy) you may notice spotting of blood in your stool or on the toilet paper. If you underwent a bowel prep for your procedure, you may not have a normal bowel movement for a few days.  Please Note:  You might notice some irritation and congestion in your nose or some drainage.  This is from the oxygen used during your procedure.  There is no need for concern and it should clear up in a day or so.  SYMPTOMS TO REPORT IMMEDIATELY:  Following lower endoscopy (colonoscopy or flexible sigmoidoscopy):  Excessive amounts of blood in the stool  Significant tenderness or worsening of abdominal pains  Swelling of the abdomen that is new, acute  Fever of 100F or higher   For urgent or emergent issues, a gastroenterologist can be reached at any hour by calling [phone]. Do not use MyChart messaging  for urgent concerns.    DIET:  We do recommend a small meal at first, but then you may proceed to your regular diet.  Drink plenty of fluids but you should avoid alcoholic beverages for 24 hours.  ACTIVITY:  You should plan to take it easy for the rest of today and you should NOT DRIVE or use heavy machinery until tomorrow (because of the sedation medicines used during the test).    FOLLOW UP: Our staff will call the number listed on your records 48-72 hours following your procedure to check on you and address any questions or concerns that you may have regarding the information given to you following your procedure. If we do not reach you, we will leave a message.  We will attempt to reach you two times.  During this call, we will ask if you have developed any symptoms of COVID 19. If you develop any symptoms (ie: fever, flu-like symptoms, shortness of breath, cough etc.) before then, please call [phone].  If you test positive for Covid 19 in the 2 weeks post procedure, please call and report this information to [place].    If any biopsies were taken you will be contacted by phone or by letter within the next 1-3 weeks.  Please call us at [phone] if you have not heard about the biopsies in 3 weeks.    SIGNATURES/CONFIDENTIALITY: You and/or your care partner have signed paperwork which will be entered into  your electronic medical record.  These signatures attest to the fact that that the information above on your After Visit Summary has been reviewed and is understood.  Full responsibility of the confidentiality of this discharge information lies with you and/or your care-partner.

## 2022-01-30 NOTE — Op Note (Signed)
[facility] Patient Name: Joy Johns Procedure Date: 01/30/2022 10:40 AM MRN: [id number] Endoscopist: Gatha Mayer , MD Age: 61 Referring MD:  Date of Birth: [birth year] Gender: Female Account #: [id number] Procedure:                Colonoscopy Indications:              Screening for colorectal malignant neoplasm Medicines:                Monitored Anesthesia Care Procedure:                Pre-Anesthesia Assessment:                           - Prior to the procedure, a History and Physical                            was performed, and patient medications and                            allergies were reviewed. The patient's tolerance of                            previous anesthesia was also reviewed. The risks                            and benefits of the procedure and the sedation                            options and risks were discussed with the patient.                            All questions were answered, and informed consent                            was obtained. Prior Anticoagulants: The patient has                            taken no previous anticoagulant or antiplatelet                            agents. ASA Grade Assessment: II - A patient with                            mild systemic disease. After reviewing the risks                            and benefits, the patient was deemed in                            satisfactory condition to undergo the procedure.                           After obtaining informed consent, the colonoscope  was passed under direct vision. Throughout the                            procedure, the patient's blood pressure, pulse, and                            oxygen saturations were monitored continuously. The                            PCF-HQ190L Colonoscope was introduced through the                            anus and advanced to the the cecum, identified by                            appendiceal  orifice and ileocecal valve. The                            colonoscopy was performed without difficulty. The                            patient tolerated the procedure well. The quality                            of the bowel preparation was excellent. The bowel                            preparation used was Miralax via split dose                            instruction. The ileocecal valve, appendiceal                            orifice, and rectum were photographed. Scope In: 10:55:56 AM Scope Out: 11:13:32 AM Scope Withdrawal Time: 0 hours 13 minutes 3 seconds  Total Procedure Duration: 0 hours 17 minutes 36 seconds  Findings:                 The perianal and digital rectal examinations were                            normal.                           Anal papilla(e) were hypertrophied.                           The exam was otherwise without abnormality on                            direct and retroflexion views. Complications:            No immediate complications. Estimated Blood Loss:     Estimated blood loss: none. Impression:               - Anal papilla(e) were hypertrophied.                           -  The examination was otherwise normal on direct                            and retroflexion views.                           - No specimens collected. Recommendation:           - Patient has a contact number available for                            emergencies. The signs and symptoms of potential                            delayed complications were discussed with the                            patient. Return to normal activities tomorrow.                            Written discharge instructions were provided to the                            patient.                           - Resume previous diet.                           - Continue present medications.                           - Repeat colonoscopy in 10 years for screening                            purposes. Gatha Mayer, MD 01/30/2022 11:20:51 AM This report has been signed electronically.

## 2022-01-30 NOTE — Progress Notes (Signed)
[facility] History and Physical   Primary Care Physician:  Kathyrn Lass, MD   Reason for Procedure:   CRCA screening  Plan:    colonoscopy     HPI: Joy Johns is a 61 y.o. female here for screening colonoscopy 2 grandparents did have CRCA  Past Medical History:  Diagnosis Date   Anemia    Cancer ([place]) 2020   basal and squamous   Carpal tunnel syndrome    DDD (degenerative disc disease)    High cholesterol     Past Surgical History:  Procedure Laterality Date   BUNIONECTOMY Bilateral 2017   CARPAL TUNNEL RELEASE Left 04/22/2014   Procedure: LEFT CARPAL TUNNEL RELEASE;  Surgeon: Leanora Cover, MD;  Location: [facility];  Service: Orthopedics;  Laterality: Left;   CARPAL TUNNEL RELEASE Right 08/19/2014   Procedure: RIGHT CARPAL TUNNEL RELEASE;  Surgeon: Leanora Cover, MD;  Location: [facility];  Service: Orthopedics;  Laterality: Right;   CERVICAL FUSION  09/03/1997   COLONOSCOPY     FINGER ARTHROSCOPY WITH CARPOMETACARPEL ([facility]) ARTHROPLASTY     both thumbs   TONSILLECTOMY      Prior to Admission medications   Medication Sig Start Date End Date Taking? Authorizing Provider  loratadine (CLARITIN) 10 MG tablet Take 10 mg by mouth daily.   Yes [provider]  sertraline (ZOLOFT) 50 MG tablet TAKE 1 TABLET BY MOUTH DAILY Patient taking differently: 50 mg 2 (two) times daily. 07/26/17  Yes Carmon Ginsberg, PA  calcium carbonate (OS-CAL) 600 MG TABS tablet Take 600 mg by mouth 2 (two) times daily with a meal. Patient not taking: Reported on 01/02/2022    [provider]  metoprolol tartrate (LOPRESSOR) 25 MG tablet TAKE 1 TABLET BY MOUTH 2 HOURS PRIOR TO CT SCAN Patient not taking: Reported on 01/02/2022 01/10/21   Buford Dresser, MD    Current Outpatient Medications  Medication Sig Dispense Refill   loratadine (CLARITIN) 10 MG tablet Take 10 mg by mouth daily.     sertraline (ZOLOFT) 50 MG tablet TAKE 1  TABLET BY MOUTH DAILY (Patient taking differently: 50 mg 2 (two) times daily.) 30 tablet 0   calcium carbonate (OS-CAL) 600 MG TABS tablet Take 600 mg by mouth 2 (two) times daily with a meal. (Patient not taking: Reported on 01/02/2022)     metoprolol tartrate (LOPRESSOR) 25 MG tablet TAKE 1 TABLET BY MOUTH 2 HOURS PRIOR TO CT SCAN (Patient not taking: Reported on 01/02/2022) 1 tablet 0   Current Facility-Administered Medications  Medication Dose Route Frequency Provider Last Rate Last Admin   0.9 %  sodium chloride infusion  500 mL Intravenous Continuous Gatha Mayer, MD        Allergies as of 01/30/2022 - Review Complete 01/30/2022  Allergen Reaction Noted   Latex  04/20/2014   Shrimp [shellfish allergy]  04/20/2014    Family History  Problem Relation Age of Onset   Colon cancer Maternal Grandmother    Colon cancer Paternal Grandfather    Colon polyps Neg Hx    Esophageal cancer Neg Hx    Stomach cancer Neg Hx    Rectal cancer Neg Hx     Social History   Socioeconomic History   Marital status: Married    Spouse name: Not on file   Number of children: Not on file   Years of education: Not on file   Highest education level: Not on file  Occupational History   Not on  file  Tobacco Use   Smoking status: Never   Smokeless tobacco: Never  Vaping Use   Vaping Use: Never used  Substance and Sexual Activity   Alcohol use: Yes    Comment: occ   Drug use: No   Sexual activity: Not on file  Other Topics Concern   Not on file  Social History Narrative   Not on file   Social Determinants of Health   Financial Resource Strain: Not on file  Food Insecurity: Not on file  Transportation Needs: Not on file  Physical Activity: Not on file  Stress: Not on file  Social Connections: Not on file  Intimate Partner Violence: Not on file    Review of Systems:  All other review of systems negative except as mentioned in the HPI.  Physical Exam: Vital signs BP 115/68   Pulse  64   Temp (!) 96.8 F (36 C) (Temporal)   Resp 11   Ht 5' 1.5" (1.562 m)   Wt 138 lb (62.6 kg)   SpO2 100%   BMI 25.65 kg/m   General:   Alert,  Well-developed, well-nourished, pleasant and cooperative in NAD Lungs:  Clear throughout to auscultation.   Heart:  Regular rate and rhythm; no murmurs, clicks, rubs,  or gallops. Abdomen:  Soft, nontender and nondistended. Normal bowel sounds.   Neuro/Psych:  Alert and cooperative. Normal mood and affect. A and O x 3   '@Lyriq Finerty'$  Simonne Maffucci, MD, [facility] [phone] (pager) 01/30/2022 10:46 AM@

## 2022-01-30 NOTE — Progress Notes (Signed)
Report to PACU, RN, vss, BBS= Clear.  

## 2022-01-31 ENCOUNTER — Telehealth: Payer: Self-pay | Admitting: *Deleted

## 2022-01-31 NOTE — Telephone Encounter (Signed)
Attempted to call patient for their post-procedure follow-up call. No answer. Left voicemail.   

## 2022-01-31 NOTE — Telephone Encounter (Signed)
Attempted to call patient for their post-procedure follow-up call. No answer.

## 2022-02-07 ENCOUNTER — Ambulatory Visit: Payer: No Typology Code available for payment source | Admitting: Obstetrics and Gynecology

## 2022-02-15 ENCOUNTER — Ambulatory Visit (INDEPENDENT_AMBULATORY_CARE_PROVIDER_SITE_OTHER): Payer: Self-pay

## 2022-02-15 ENCOUNTER — Ambulatory Visit: Payer: PRIVATE HEALTH INSURANCE | Admitting: Podiatry

## 2022-02-15 ENCOUNTER — Encounter: Payer: Self-pay | Admitting: Podiatry

## 2022-02-15 DIAGNOSIS — M779 Enthesopathy, unspecified: Secondary | ICD-10-CM

## 2022-02-16 NOTE — Progress Notes (Signed)
Subjective:   Patient ID: Joy Johns, female   DOB: 61 y.o.   MRN: [id number]   HPI Patient presents she has had a 50-monthhistory of pain on top of her right foot that has been sore stating that it hurts when she walks in shoe gear and that she does not remember injury.  States its not really gotten better she does not smoke likes to be active   Review of Systems  All other systems reviewed and are negative.       Objective:  Physical Exam Vitals and nursing note reviewed.  Constitutional:      Appearance: She is well-developed.  Pulmonary:     Effort: Pulmonary effort is normal.  Musculoskeletal:        General: Normal range of motion.  Skin:    General: Skin is warm.  Neurological:     Mental Status: She is alert.     Neurovascular status intact muscle strength found to be adequate range of motion adequate.  Patient is found to have inflammation on the dorsum of the right foot around the metatarsal shaft none diffuse with no specific spot that appears to be tender.  Patient has good digital perfusion well oriented x3     Assessment:  Dorsal tendinitis right with inflammation with no indication currently of arthritis and does not seem to fit stress fracture given 222-monthistory     Plan:  H&P x-rays reviewed.  At this point I did go ahead and I did a distal injection around the distal metatarsal shafts into the extensor complex 3 mg dexamethasone Kenalog 5 mg Xylocaine advised on ice and if symptoms persist I want to see him back again  X-rays are negative for signs of fracture negative for signs of arthritis or calcification

## 2022-06-25 ENCOUNTER — Encounter: Payer: Self-pay | Admitting: *Deleted

## 2022-07-06 ENCOUNTER — Other Ambulatory Visit: Payer: Self-pay | Admitting: Family Medicine

## 2022-07-06 DIAGNOSIS — Z1231 Encounter for screening mammogram for malignant neoplasm of breast: Secondary | ICD-10-CM

## 2022-08-02 ENCOUNTER — Encounter: Payer: Self-pay | Admitting: Radiology

## 2022-08-02 ENCOUNTER — Other Ambulatory Visit (HOSPITAL_COMMUNITY)
Admission: RE | Admit: 2022-08-02 | Discharge: 2022-08-02 | Disposition: A | Discharge status: Home / Self Care | Payer: No Typology Code available for payment source | Source: Ambulatory Visit | Attending: Radiology | Admitting: Radiology

## 2022-08-02 ENCOUNTER — Ambulatory Visit (INDEPENDENT_AMBULATORY_CARE_PROVIDER_SITE_OTHER): Payer: No Typology Code available for payment source | Admitting: Radiology

## 2022-08-02 VITALS — BP 116/72 | Ht 61.0 in | Wt 134.0 lb

## 2022-08-02 DIAGNOSIS — N393 Stress incontinence (female) (male): Secondary | ICD-10-CM

## 2022-08-02 DIAGNOSIS — Z01419 Encounter for gynecological examination (general) (routine) without abnormal findings: Secondary | ICD-10-CM

## 2022-08-02 DIAGNOSIS — E2839 Other primary ovarian failure: Secondary | ICD-10-CM

## 2022-08-02 DIAGNOSIS — N958 Other specified menopausal and perimenopausal disorders: Secondary | ICD-10-CM

## 2022-08-02 LAB — URINALYSIS, COMPLETE W/RFL CULTURE
Bacteria, UA: NONE SEEN /HPF
Bilirubin Urine: NEGATIVE
Casts: NONE SEEN /LPF
Crystals: NONE SEEN /HPF
Glucose, UA: NEGATIVE
Hgb urine dipstick: NEGATIVE
Hyaline Cast: NONE SEEN /LPF
Ketones, ur: NEGATIVE
Leukocyte Esterase: NEGATIVE
Nitrites, Initial: NEGATIVE
Protein, ur: NEGATIVE
RBC / HPF: NONE SEEN /HPF (ref 0–2)
Specific Gravity, Urine: 1.015 (ref 1.001–1.035)
WBC, UA: NONE SEEN /HPF (ref 0–5)
Yeast: NONE SEEN /HPF
pH: 7 (ref 5.0–8.0)

## 2022-08-02 LAB — NO CULTURE INDICATED

## 2022-08-02 MED ORDER — IMVEXXY MAINTENANCE PACK 10 MCG VA INST: 1.0000 | VAGINAL_INSERT | VAGINAL | 4 refills | Status: DC

## 2022-08-02 NOTE — Progress Notes (Signed)
Joy Johns [birth year] [id number]   History: Postmenopausal 61 y.o. presents for annual exam as a new patient. Reports dyspareunia and bleeding with intercourse x 1 year. Using replens as a lubricant. Stress incontinence worsening over the past few years with some urge incontinence when changing positions or at night.    Gynecologic History Postmenopausal Last Pap: 2019. Results were: normal Last mammogram: 2022. Results were: normal Last colonoscopy: 2023, normal repeat 10 years HRT use: never  Obstetric History OB History  Gravida Para Term Preterm AB Living  '3 2       2  '$ SAB IAB Ectopic Multiple Live Births               # Outcome Date GA Lbr Len/2nd Weight Sex Delivery Anes PTL Lv  3 Gravida           2 Para           1 Para              The following portions of the patient's history were reviewed and updated as appropriate: allergies, current medications, past family history, past medical history, past social history, past surgical history, and problem list.  Review of Systems Pertinent items noted in HPI and remainder of comprehensive ROS otherwise negative.  Past medical history, past surgical history, family history and social history were all reviewed and documented in the EPIC chart.  Exam:  Vitals:   08/02/22 1022  BP: 116/72  Weight: 134 lb (60.8 kg)  Height: '5\' 1"'$  (1.549 m)   Body mass index is 25.32 kg/m.  General appearance:  Normal Thyroid:  Symmetrical, normal in size, without palpable masses or nodularity. Respiratory  Auscultation:  Clear without wheezing or rhonchi Cardiovascular  Auscultation:  Regular rate, without rubs, murmurs or gallops  Edema/varicosities:  Not grossly evident Abdominal  Soft,nontender, without masses, guarding or rebound.  Liver/spleen:  No organomegaly noted  Hernia:  None appreciated  Skin  Inspection:  Grossly normal Breasts: Examined lying and sitting.   Right: Without masses, retractions, nipple discharge  or axillary adenopathy.   Left: Without masses, retractions, nipple discharge or axillary adenopathy. Genitourinary   Inguinal/mons:  Normal without inguinal adenopathy  External genitalia:  Normal appearing vulva with no masses, tenderness, or lesions  BUS/Urethra/Skene's glands:  Normal  Vagina:  Normal appearing with normal color and discharge, no lesions. Atrophy: moderate   Cervix:  Normal appearing without discharge or lesions  Uterus:  Normal in size, shape and contour.  Midline and mobile, nontender  Adnexa/parametria:     Rt: Normal in size, without masses or tenderness.   Lt: Normal in size, without masses or tenderness.  Anus and perineum: Normal    Patient informed chaperone available to be present for breast and pelvic exam. Patient has requested no chaperone to be present. Patient has been advised what will be completed during breast and pelvic exam.   Assessment/Plan:   1. Well woman exam with routine gynecological exam _ Mammo scheduled 09/05/22 - Cytology - PAP( [facility])  2. Stress incontinence of urine - PFPT referral - Urinalysis,Complete w/RFL Culture  3. Genitourinary syndrome of menopause - Estradiol (IMVEXXY MAINTENANCE PACK) 10 MCG INST; Place 1 Insert vaginally 2 (two) times a week. At bedtime  Dispense: 24 each; Refill: 4  4. Estrogen deficiency - DG Bone Density; Future    Discussed SBE, colonoscopy and DEXA screening as directed. Recommend 121mns of exercise weekly, including weight bearing exercise. Encouraged the use of  seatbelts and sunscreen.  Return in 1 year for annual or sooner prn.  Kerry Dory WHNP-BC, 10:54 AM 08/02/2022

## 2022-08-03 LAB — CYTOLOGY - PAP
Adequacy: ABSENT
Comment: NEGATIVE
Diagnosis: NEGATIVE
High risk HPV: NEGATIVE

## 2022-08-07 ENCOUNTER — Ambulatory Visit (INDEPENDENT_AMBULATORY_CARE_PROVIDER_SITE_OTHER): Payer: No Typology Code available for payment source

## 2022-08-07 ENCOUNTER — Other Ambulatory Visit: Payer: Self-pay | Admitting: Radiology

## 2022-08-07 DIAGNOSIS — Z780 Asymptomatic menopausal state: Secondary | ICD-10-CM

## 2022-08-07 DIAGNOSIS — Z13820 Encounter for screening for osteoporosis: Secondary | ICD-10-CM

## 2022-08-07 DIAGNOSIS — Z01419 Encounter for gynecological examination (general) (routine) without abnormal findings: Secondary | ICD-10-CM

## 2022-08-07 DIAGNOSIS — M810 Age-related osteoporosis without current pathological fracture: Secondary | ICD-10-CM

## 2022-08-07 DIAGNOSIS — N958 Other specified menopausal and perimenopausal disorders: Secondary | ICD-10-CM

## 2022-08-07 DIAGNOSIS — N393 Stress incontinence (female) (male): Secondary | ICD-10-CM

## 2022-08-07 DIAGNOSIS — E2839 Other primary ovarian failure: Secondary | ICD-10-CM

## 2022-09-05 ENCOUNTER — Ambulatory Visit: Payer: No Typology Code available for payment source

## 2022-09-12 ENCOUNTER — Ambulatory Visit: Payer: No Typology Code available for payment source | Admitting: Radiology

## 2022-09-12 VITALS — BP 124/72

## 2022-09-12 DIAGNOSIS — M810 Age-related osteoporosis without current pathological fracture: Secondary | ICD-10-CM | POA: Diagnosis not present

## 2022-09-12 NOTE — Progress Notes (Signed)
   VALLARIE FEI Mar 11, 1961 [id number]   History: Postmenopausal 62 y.o. presents to discuss DEXA results. Estrogen deficient, hx of post menopausal foot fracture.   DEXA shows osteoporosis with a AP Spine T score of -2.5, left femoral neck T score of -2.7 and right femoral neck T score of -2.8   Gynecologic History Postmenopausal DEXA: 12/23 osteoporosis   Obstetric History OB History  Gravida Para Term Preterm AB Living  '3 2       2  '$ SAB IAB Ectopic Multiple Live Births               # Outcome Date GA Lbr Len/2nd Weight Sex Delivery Anes PTL Lv  3 Gravida           2 Para           1 Para              The following portions of the patient's history were reviewed and updated as appropriate: allergies, current medications, past family history, past medical history, past social history, past surgical history, and problem list.  Review of Systems Pertinent items noted in HPI and remainder of comprehensive ROS otherwise negative.  Past medical history, past surgical history, family history and social history were all reviewed and documented in the EPIC chart.  Exam:  Vitals:   09/12/22 0810  BP: 124/72   There is no height or weight on file to calculate BMI.    Assessment/Plan:   1. Age-related osteoporosis without current pathological fracture Discussed management of osteoporosis with and without medication. Recommendations include bisphosphonates or Eveneity x 1 year with transition to prolia after 12 months. Continue weight bearing exercises, calcium and vitamin D supplementation.  Will consider options and contact me with how she would prefer to proceed.  Rubbie Battiest B WHNP-BC, 10:02 AM 09/12/2022

## 2022-09-18 ENCOUNTER — Other Ambulatory Visit: Payer: Self-pay | Admitting: Radiology

## 2022-09-18 DIAGNOSIS — Q782 Osteopetrosis: Secondary | ICD-10-CM

## 2022-09-18 NOTE — Telephone Encounter (Signed)
Please schedule lab appt. I will put in orders. Once labs are received I will send rx.

## 2022-09-19 ENCOUNTER — Other Ambulatory Visit: Payer: No Typology Code available for payment source

## 2022-09-19 DIAGNOSIS — Q782 Osteopetrosis: Secondary | ICD-10-CM

## 2022-09-20 ENCOUNTER — Other Ambulatory Visit: Payer: Self-pay | Admitting: *Deleted

## 2022-09-20 DIAGNOSIS — E559 Vitamin D deficiency, unspecified: Secondary | ICD-10-CM

## 2022-09-20 LAB — COMPREHENSIVE METABOLIC PANEL
AG Ratio: 2 (calc) (ref 1.0–2.5)
ALT: 15 U/L (ref 6–29)
AST: 24 U/L (ref 10–35)
Albumin: 4.1 g/dL (ref 3.6–5.1)
Alkaline phosphatase (APISO): 52 U/L (ref 37–153)
BUN: 14 mg/dL (ref 7–25)
CO2: 27 mmol/L (ref 20–32)
Calcium: 9.1 mg/dL (ref 8.6–10.4)
Chloride: 102 mmol/L (ref 98–110)
Creat: 0.75 mg/dL (ref 0.50–1.05)
Globulin: 2.1 g/dL (calc) (ref 1.9–3.7)
Glucose, Bld: 83 mg/dL (ref 65–99)
Potassium: 4.9 mmol/L (ref 3.5–5.3)
Sodium: 136 mmol/L (ref 135–146)
Total Bilirubin: 0.4 mg/dL (ref 0.2–1.2)
Total Protein: 6.2 g/dL (ref 6.1–8.1)

## 2022-09-20 LAB — THYROID PANEL WITH TSH
Free Thyroxine Index: 1.9 (ref 1.4–3.8)
T3 Uptake: 27 % (ref 22–35)
T4, Total: 6.9 ug/dL (ref 5.1–11.9)
TSH: 1.67 mIU/L (ref 0.40–4.50)

## 2022-09-20 LAB — VITAMIN D 25 HYDROXY (VIT D DEFICIENCY, FRACTURES): Vit D, 25-Hydroxy: 13 ng/mL — ABNORMAL LOW (ref 30–100)

## 2022-09-20 MED ORDER — VITAMIN D (ERGOCALCIFEROL) 1.25 MG (50000 UNIT) PO CAPS: 50000.0000 [IU] | ORAL_CAPSULE | ORAL | 0 refills | Status: DC

## 2022-09-21 ENCOUNTER — Other Ambulatory Visit: Payer: Self-pay | Admitting: Radiology

## 2022-09-21 DIAGNOSIS — Q782 Osteopetrosis: Secondary | ICD-10-CM

## 2022-09-21 DIAGNOSIS — M810 Age-related osteoporosis without current pathological fracture: Secondary | ICD-10-CM

## 2022-09-21 MED ORDER — ALENDRONATE SODIUM 70 MG PO TABS: 70.0000 mg | ORAL_TABLET | ORAL | 4 refills | Status: DC

## 2022-09-21 NOTE — Progress Notes (Signed)
May start now, does not have to wait to vit D to increase. Rx has been sent for the fosamax.

## 2022-10-10 ENCOUNTER — Ambulatory Visit
Admission: RE | Admit: 2022-10-10 | Discharge: 2022-10-10 | Disposition: A | Discharge status: Home / Self Care | Payer: No Typology Code available for payment source | Source: Ambulatory Visit | Attending: Family Medicine | Admitting: Family Medicine

## 2022-10-10 DIAGNOSIS — Z1231 Encounter for screening mammogram for malignant neoplasm of breast: Secondary | ICD-10-CM

## 2022-12-11 ENCOUNTER — Other Ambulatory Visit: Payer: Self-pay | Admitting: Radiology

## 2022-12-17 ENCOUNTER — Other Ambulatory Visit: Payer: No Typology Code available for payment source

## 2022-12-17 DIAGNOSIS — E559 Vitamin D deficiency, unspecified: Secondary | ICD-10-CM

## 2022-12-18 LAB — VITAMIN D 25 HYDROXY (VIT D DEFICIENCY, FRACTURES): Vit D, 25-Hydroxy: 54 ng/mL (ref 30–100)

## 2022-12-18 NOTE — Progress Notes (Signed)
Vit D much improved. For maintenance I recommend vit D3 with k2 5000iu nightly.

## 2022-12-19 NOTE — Telephone Encounter (Signed)
Correct, K2 is a vitamin which helps D3 absorb, when ordering d3 5000iu K2 dose is standard.

## 2022-12-19 NOTE — Telephone Encounter (Signed)
Genia Del, MD  Keenan Bachelor, RMA1 hour ago (9:35 AM)    To clarify, check with Jami C, I think she meant Vit D 5000 IU nightly and Vit K2 100 microgram daily.  Vit K2 helps absorb Vit D. Dr Larrie Kass to [place], NP to review 12/17/22 lab results and clarify.

## 2022-12-19 NOTE — Telephone Encounter (Signed)
Spoke with patient, advised per Clearnce Hasten, NP.  Patient understands recommendations. Patient is requesting additional resources as far as studies, guidelines, resources where the recommendations for k2 come from. Advised I will forward to [facility], Np to advise. Patient requesting MyChart message with information.  Clearnce Hasten, NP -please review.

## 2022-12-19 NOTE — Telephone Encounter (Signed)
Last read by Jerolyn Center at 12:27 PM on 12/19/2022.   Encounter closed.

## 2023-06-01 IMAGING — CT CT HEART MORP W/ CTA COR W/ SCORE W/ CA W/CM &/OR W/O CM
4 of 7 series · 8 of 20 positions shown, 9 images · non-contrast
Comparison: None.
COMPARISON: None.

Addendum:
EXAM:
OVER-READ INTERPRETATION  CT CHEST

The following report is an over-read performed by radiologist Dr.
Anderson Silva Norberto [REDACTED] on 05/24/2021. This
over-read does not include interpretation of cardiac or coronary
anatomy or pathology. The coronary calcium score/coronary CTA
interpretation by the cardiologist is attached.
CLINICAL DATA: Chest pain
Cardiac CTA
MEDICATIONS:
Sub lingual nitro. 4 mg and lopressor 25mg
TECHNIQUE: The patient was scanned on a Siemens Force 192 scanner. Gantry
rotation speed was 250 msecs. Collimation was. 6 mm . A 120 kV
prospective scan was triggered in the ascending thoracic aorta at
140 HU's with full mA between 30-70% of the R-R interval . Average
HR during the scan was 54 bpm. The 3D data set was interpreted on a
dedicated work station using MPR, MIP and VRT modes. A total of 80
cc of contrast was used.

[Series 6: best diast 73 % · axial · 0.35mm/px · z∈[+1062,+1107]mm · 2 of 336 slices shown, 3 images]
[im 112/336  vessel]
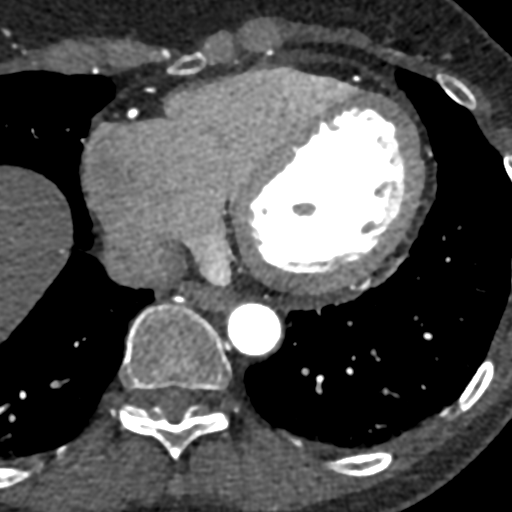
[im 112/336  lung]
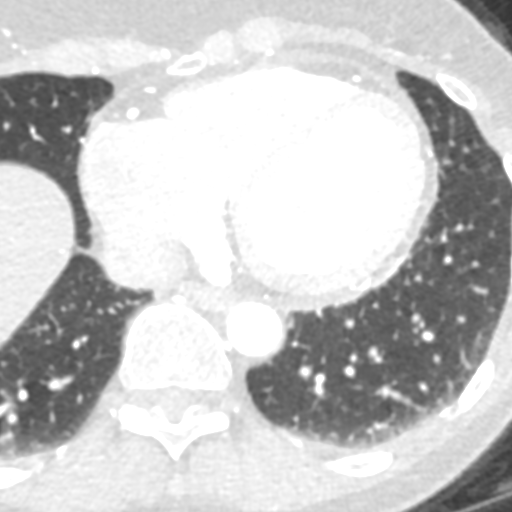
[im 224/336  vessel]
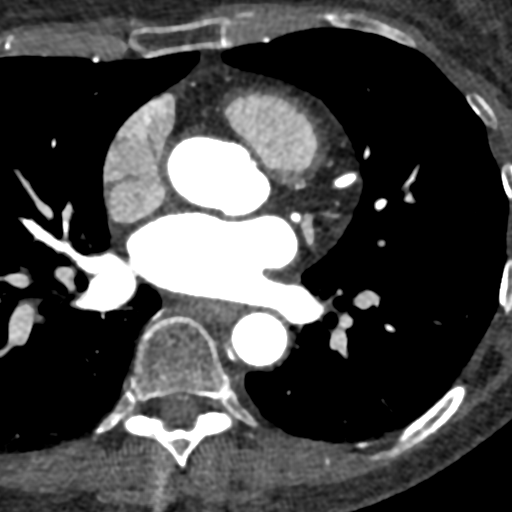

[Series 7: best syst 36 % · axial · 0.35mm/px · z∈[+1062,+1107]mm · 2 of 336 slices shown]
[im 112/336  vessel]
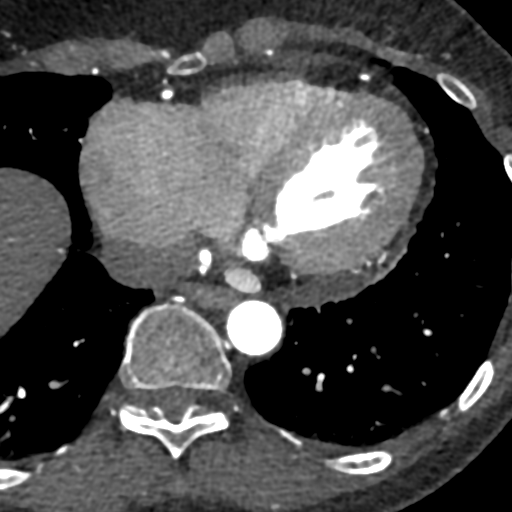
[im 224/336  vessel]
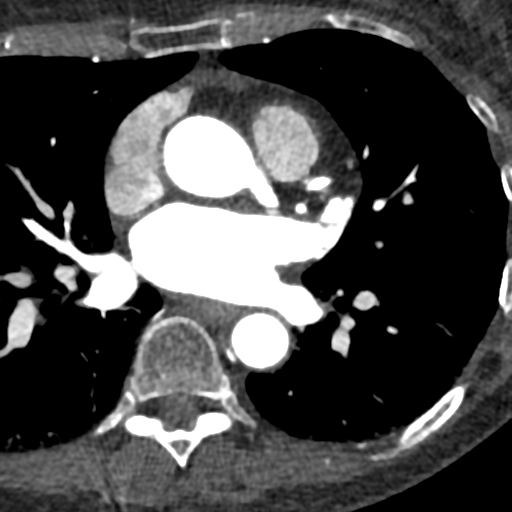

[Series 8: ts diast sharp 73 % · axial · 0.35mm/px · z∈[+1062,+1107]mm · 2 of 336 slices shown]
[im 112/336  lung]
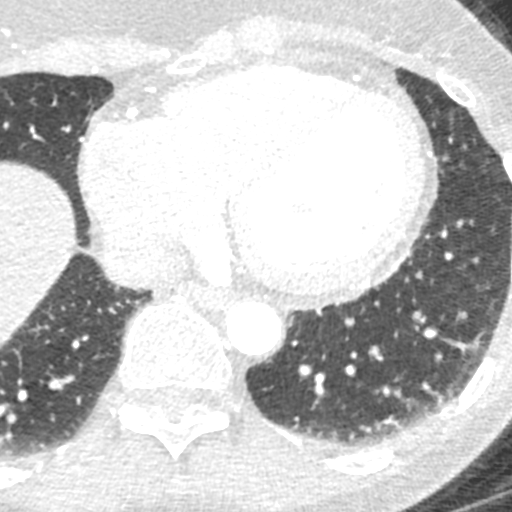
[im 224/336  lung]
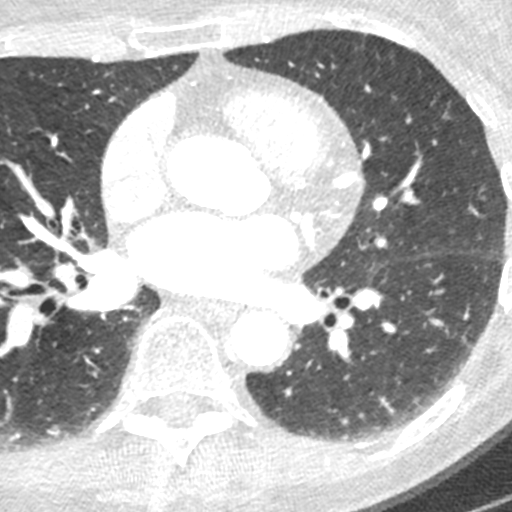

[Series 9: ts syst sharp 36 % · axial · 0.35mm/px · z∈[+1062,+1107]mm · 2 of 336 slices shown]
[im 112/336  lung]
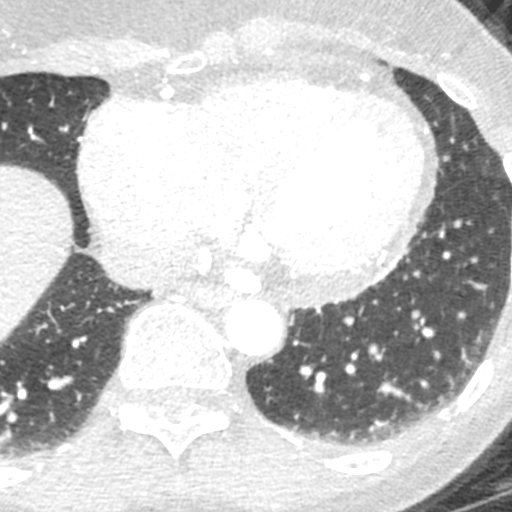
[im 224/336  lung]
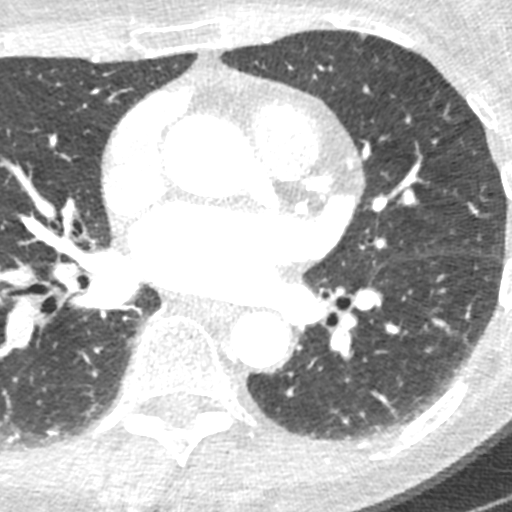

[8 of 20 positions shown; findings below may reference images not displayed]

FINDINGS: Within the visualized portions of the thorax there are no suspicious
appearing pulmonary nodules or masses, there is no acute
consolidative airspace disease, no pleural effusions, no
pneumothorax and no lymphadenopathy. Visualized portions of the
upper abdomen demonstrate an incompletely imaged low-attenuation
lesion measuring at least 1.2 x 0.9 cm in segment 2 of the liver,
incompletely characterize, but statistically likely to represent a
small cyst. There are no aggressive appearing lytic or blastic
lesions noted in the visualized portions of the skeleton.
IMPRESSION: 1. No significant incidental noncardiac findings are noted.
FINDINGS: Non-cardiac: See separate report from [REDACTED]. No
significant findings on limited lung and soft tissue windows.

Calcium score: No calcium noted

Coronary Arteries: Right dominant with no anomalies

LM: Normal

LAD: Normal

D1: Normal Small

D2: Normal Large

Circumflex: Normal

OM1: Normal

OM2: Normal

RCA: Normal

PDA: Normal

PLA: Normal
IMPRESSION: 1. Calcium score 0

2.  Normal ascending thoracic aorta 2.9 cm

3.  CAD RADS 0 normal right dominant coronary arteries

Zhaine Qb

*** End of Addendum ***
EXAM:
OVER-READ INTERPRETATION  CT CHEST

The following report is an over-read performed by radiologist Dr.
Anderson Silva Norberto [REDACTED] on 05/24/2021. This
over-read does not include interpretation of cardiac or coronary
anatomy or pathology. The coronary calcium score/coronary CTA
interpretation by the cardiologist is attached.
FINDINGS: Within the visualized portions of the thorax there are no suspicious
appearing pulmonary nodules or masses, there is no acute
consolidative airspace disease, no pleural effusions, no
pneumothorax and no lymphadenopathy. Visualized portions of the
upper abdomen demonstrate an incompletely imaged low-attenuation
lesion measuring at least 1.2 x 0.9 cm in segment 2 of the liver,
incompletely characterize, but statistically likely to represent a
small cyst. There are no aggressive appearing lytic or blastic
lesions noted in the visualized portions of the skeleton.
IMPRESSION: 1. No significant incidental noncardiac findings are noted.

## 2023-08-06 ENCOUNTER — Encounter: Payer: Self-pay | Admitting: Radiology

## 2023-08-06 ENCOUNTER — Ambulatory Visit (INDEPENDENT_AMBULATORY_CARE_PROVIDER_SITE_OTHER): Payer: No Typology Code available for payment source | Admitting: Radiology

## 2023-08-06 VITALS — BP 110/72 | Ht 61.0 in | Wt 132.0 lb

## 2023-08-06 DIAGNOSIS — M810 Age-related osteoporosis without current pathological fracture: Secondary | ICD-10-CM

## 2023-08-06 DIAGNOSIS — N941 Unspecified dyspareunia: Secondary | ICD-10-CM

## 2023-08-06 DIAGNOSIS — N952 Postmenopausal atrophic vaginitis: Secondary | ICD-10-CM

## 2023-08-06 DIAGNOSIS — Z01419 Encounter for gynecological examination (general) (routine) without abnormal findings: Secondary | ICD-10-CM

## 2023-08-06 DIAGNOSIS — N958 Other specified menopausal and perimenopausal disorders: Secondary | ICD-10-CM

## 2023-08-06 DIAGNOSIS — M8000XG Age-related osteoporosis with current pathological fracture, unspecified site, subsequent encounter for fracture with delayed healing: Secondary | ICD-10-CM

## 2023-08-06 MED ORDER — IMVEXXY MAINTENANCE PACK 10 MCG VA INST: 1.0000 | VAGINAL_INSERT | VAGINAL | 4 refills | Status: DC

## 2023-08-06 NOTE — Progress Notes (Signed)
Joy Johns [birth year] [id number]   History: Postmenopausal 62 y.o. presents for annual exam. Has been on fosamax x 11 month, has new compression fracture of vertebrae, would like to discuss options for management.  Her stress incontinence is much improved after PFPT however dyspareunia has not improved. She is using imvexxy which has helped with dryness and a lubricant for intercourse.   Gynecologic History Postmenopausal Last Pap: 2023. Results were: normal Last mammogram: 1/24. Results were: normal Last colonoscopy: 2023, normal repeat 10 years HRT use: never  Obstetric History OB History  Gravida Para Term Preterm AB Living  3 2       2   SAB IAB Ectopic Multiple Live Births               # Outcome Date GA Lbr Len/2nd Weight Sex Type Anes PTL Lv  3 Gravida           2 Para           1 Para              The following portions of the patient's history were reviewed and updated as appropriate: allergies, current medications, past family history, past medical history, past social history, past surgical history, and problem list.  Review of Systems Pertinent items noted in HPI and remainder of comprehensive ROS otherwise negative.  Past medical history, past surgical history, family history and social history were all reviewed and documented in the EPIC chart.  Exam:  Vitals:   08/06/23 0909  BP: 110/72  Weight: 132 lb (59.9 kg)  Height: 5\' 1"  (1.549 m)   Body mass index is 24.94 kg/m.  General appearance:  Normal Thyroid:  Symmetrical, normal in size, without palpable masses or nodularity. Respiratory  Auscultation:  Clear without wheezing or rhonchi Cardiovascular  Auscultation:  Regular rate, without rubs, murmurs or gallops  Edema/varicosities:  Not grossly evident Abdominal  Soft,nontender, without masses, guarding or rebound.  Liver/spleen:  No organomegaly noted  Hernia:  None appreciated  Skin  Inspection:  Grossly normal Breasts: Examined lying and  sitting.   Right: Without masses, retractions, nipple discharge or axillary adenopathy.   Left: Without masses, retractions, nipple discharge or axillary adenopathy. Genitourinary   Inguinal/mons:  Normal without inguinal adenopathy  External genitalia:  Normal appearing vulva with no masses, tenderness, or lesions  BUS/Urethra/Skene's glands:  Normal  Vagina:  Normal appearing with normal color and discharge, no lesions. Atrophy: moderate but improved  Cervix:  Normal appearing without discharge or lesions  Uterus:  Normal in size, shape and contour.  Midline and mobile, nontender  Adnexa/parametria:     Rt: Normal in size, without masses or tenderness.   Lt: Normal in size, without masses or tenderness.  Anus and perineum: Normal    Patient informed chaperone available to be present for breast and pelvic exam. Patient has requested no chaperone to be present. Patient has been advised what will be completed during breast and pelvic exam.   Assessment/Plan:   1. Well woman exam with routine gynecological exam Pap 2026  2. Age-related osteoporosis with current pathological fracture with delayed healing, subsequent encounter Currently on fosamax, new compression fracture of spine (along with previous fractures of spine and left foot) Discussed Evenity to build bone, open to trying, will initiate PA  3. Genitourinary syndrome of menopause Tissue improvement since using - Estradiol (IMVEXXY MAINTENANCE PACK) 10 MCG INST; Place 1 Insert vaginally 2 (two) times a week. At bedtime  Dispense: 24  each; Refill: 4  4. Dyspareunia in female  No improvement after PFPT Continue imvexxy, add vaginal dilators    Discussed SBE, colonoscopy and DEXA screening as directed. Recommend of exercise weekly, including weight bearing exercise. Return in 1 year for annual or sooner prn.  Tanda Rockers WHNP-BC, 10:39 AM 08/06/2023

## 2023-08-06 NOTE — Patient Instructions (Signed)
Preventive Care 40-62 Years Old, Female Preventive care refers to lifestyle choices and visits with your health care provider that can promote health and wellness. Preventive care visits are also called wellness exams. What can I expect for my preventive care visit? Counseling Your health care provider may ask you questions about your: Medical history, including: Past medical problems. Family medical history. Pregnancy history. Current health, including: Menstrual cycle. Method of birth control. Emotional well-being. Home life and relationship well-being. Sexual activity and sexual health. Lifestyle, including: Alcohol, nicotine or tobacco, and drug use. Access to firearms. Diet, exercise, and sleep habits. Work and work environment. Sunscreen use. Safety issues such as seatbelt and bike helmet use. Physical exam Your health care provider will check your: Height and weight. These may be used to calculate your BMI (body mass index). BMI is a measurement that tells if you are at a healthy weight. Waist circumference. This measures the distance around your waistline. This measurement also tells if you are at a healthy weight and may help predict your risk of certain diseases, such as type 2 diabetes and high blood pressure. Heart rate and blood pressure. Body temperature. Skin for abnormal spots. What immunizations do I need?  Vaccines are usually given at various ages, according to a schedule. Your health care provider will recommend vaccines for you based on your age, medical history, and lifestyle or other factors, such as travel or where you work. What tests do I need? Screening Your health care provider may recommend screening tests for certain conditions. This may include: Lipid and cholesterol levels. Diabetes screening. This is done by checking your blood sugar (glucose) after you have not eaten for a while (fasting). Pelvic exam and Pap test. Hepatitis B test. Hepatitis C  test. HIV (human immunodeficiency virus) test. STI (sexually transmitted infection) testing, if you are at risk. Lung cancer screening. Colorectal cancer screening. Mammogram. Talk with your health care provider about when you should start having regular mammograms. This may depend on whether you have a family history of breast cancer. BRCA-related cancer screening. This may be done if you have a family history of breast, ovarian, tubal, or peritoneal cancers. Bone density scan. This is done to screen for osteoporosis. Talk with your health care provider about your test results, treatment options, and if necessary, the need for more tests. Follow these instructions at home: Eating and drinking  Eat a diet that includes fresh fruits and vegetables, whole grains, lean protein, and low-fat dairy products. Take vitamin and mineral supplements as recommended by your health care provider. Do not drink alcohol if: Your health care provider tells you not to drink. You are pregnant, may be pregnant, or are planning to become pregnant. If you drink alcohol: Limit how much you have to 0-1 drink a day. Know how much alcohol is in your drink. In the [place] one drink equals one 12 oz bottle of beer (355 mL), one 5 oz glass of wine (148 mL), or one 1 oz glass of hard liquor (44 mL). Lifestyle Brush your teeth every morning and night with fluoride toothpaste. Floss one time each day. Exercise for at least 30 minutes 5 or more days each week. Do not use any products that contain nicotine or tobacco. These products include cigarettes, chewing tobacco, and vaping devices, such as e-cigarettes. If you need help quitting, ask your health care provider. Do not use drugs. If you are sexually active, practice safe sex. Use a condom or other form of protection to   prevent STIs. If you do not wish to become pregnant, use a form of birth control. If you plan to become pregnant, see your health care provider for a  prepregnancy visit. Take aspirin only as told by your health care provider. Make sure that you understand how much to take and what form to take. Work with your health care provider to find out whether it is safe and beneficial for you to take aspirin daily. Find healthy ways to manage stress, such as: Meditation, yoga, or listening to music. Journaling. Talking to a trusted person. Spending time with friends and family. Minimize exposure to UV radiation to reduce your risk of skin cancer. Safety Always wear your seat belt while driving or riding in a vehicle. Do not drive: If you have been drinking alcohol. Do not ride with someone who has been drinking. When you are tired or distracted. While texting. If you have been using any mind-altering substances or drugs. Wear a helmet and other protective equipment during sports activities. If you have firearms in your house, make sure you follow all gun safety procedures. Seek help if you have been physically or sexually abused. What's next? Visit your health care provider once a year for an annual wellness visit. Ask your health care provider how often you should have your eyes and teeth checked. Stay up to date on all vaccines. This information is not intended to replace advice given to you by your health care provider. Make sure you discuss any questions you have with your health care provider. Document Revised: 02/15/2021 Document Reviewed: 02/15/2021 Elsevier Patient Education  2024 Elsevier Inc.  

## 2023-08-07 ENCOUNTER — Telehealth: Payer: Self-pay | Admitting: *Deleted

## 2023-08-07 NOTE — Telephone Encounter (Signed)
 Insurance information submitted to [employer] portal. Will await summary of benefits for Evenity.

## 2023-08-07 NOTE — Telephone Encounter (Signed)
-----   Message from [place], [place] B sent at 08/06/2023 10:35 AM EST ----- Regarding: Joy Johns,  Can we start a PA for Evenity for this patient? Known osteoporosis, on fosamax but has a new compression fracture of the spine (along with known previous fx of spine and foot). She is also inquiring if it would be have to be given here every month OR if she can have one of her nurses at work give it to her (works at [employer]).I said I would check with you first.  Thanks!

## 2023-09-19 ENCOUNTER — Other Ambulatory Visit: Payer: Self-pay | Admitting: Family Medicine

## 2023-09-19 DIAGNOSIS — Z1231 Encounter for screening mammogram for malignant neoplasm of breast: Secondary | ICD-10-CM

## 2023-09-25 NOTE — Telephone Encounter (Signed)
Patient enrolled in copay card for Evenity. Per Amy at [employer], benefits need to be rerun through [employer] for calendar year 2025. Benefits resubmitted. Will await response.

## 2023-09-29 ENCOUNTER — Other Ambulatory Visit: Payer: Self-pay | Admitting: Radiology

## 2023-09-29 DIAGNOSIS — M810 Age-related osteoporosis without current pathological fracture: Secondary | ICD-10-CM

## 2023-09-30 NOTE — Telephone Encounter (Signed)
Med refill request: Fosamax Last AEX: 08/06/2023 Next AEX:not schedule, sent message to front to schedule annual visit Last MMG (if hormonal med) 10/10/2022 Refill authorized: Last Rx sent #12 with 4 refills on 09/21/2022. Please approve or deny as appropriate.

## 2023-10-02 NOTE — Telephone Encounter (Signed)
Waiting on PA for Evenity. Will keep in JC's box for now.

## 2023-10-05 DIAGNOSIS — M810 Age-related osteoporosis without current pathological fracture: Secondary | ICD-10-CM

## 2023-10-07 MED ORDER — ALENDRONATE SODIUM 70 MG PO TABS: 70.0000 mg | ORAL_TABLET | ORAL | 0 refills | Status: DC

## 2023-10-07 NOTE — Telephone Encounter (Signed)
Med refill request: Alendronate 70 mg tab weekly  Last AEX: 08/06/23- JC  Next AEX: Not scheduled   BMD 08/07/22 - Osteoporosis    Refill authorized: Please Advise?

## 2023-10-14 NOTE — Telephone Encounter (Signed)
 Patient decided not to proceed with Evenity. See MyChart message dated 10-05-23. Patient will continue with alendronate .   Encounter closed.

## 2023-10-15 ENCOUNTER — Ambulatory Visit
Admission: RE | Admit: 2023-10-15 | Discharge: 2023-10-15 | Disposition: A | Discharge status: Home / Self Care | Payer: No Typology Code available for payment source | Source: Ambulatory Visit

## 2023-10-15 DIAGNOSIS — Z1231 Encounter for screening mammogram for malignant neoplasm of breast: Secondary | ICD-10-CM

## 2023-11-13 ENCOUNTER — Emergency Department (HOSPITAL_BASED_OUTPATIENT_CLINIC_OR_DEPARTMENT_OTHER)
Admission: EM | Admit: 2023-11-13 | Discharge: 2023-11-13 | Disposition: A | Discharge status: Home / Self Care | Payer: Worker's Compensation | Attending: Emergency Medicine | Admitting: Emergency Medicine

## 2023-11-13 ENCOUNTER — Other Ambulatory Visit: Payer: Self-pay

## 2023-11-13 ENCOUNTER — Emergency Department (HOSPITAL_BASED_OUTPATIENT_CLINIC_OR_DEPARTMENT_OTHER): Payer: Self-pay

## 2023-11-13 ENCOUNTER — Encounter (HOSPITAL_BASED_OUTPATIENT_CLINIC_OR_DEPARTMENT_OTHER): Payer: Self-pay | Admitting: Emergency Medicine

## 2023-11-13 DIAGNOSIS — S8011XA Contusion of right lower leg, initial encounter: Secondary | ICD-10-CM | POA: Diagnosis not present

## 2023-11-13 DIAGNOSIS — Z79899 Other long term (current) drug therapy: Secondary | ICD-10-CM | POA: Diagnosis not present

## 2023-11-13 DIAGNOSIS — S40012A Contusion of left shoulder, initial encounter: Secondary | ICD-10-CM | POA: Diagnosis present

## 2023-11-13 DIAGNOSIS — W108XXA Fall (on) (from) other stairs and steps, initial encounter: Secondary | ICD-10-CM | POA: Diagnosis not present

## 2023-11-13 DIAGNOSIS — W19XXXA Unspecified fall, initial encounter: Secondary | ICD-10-CM

## 2023-11-13 DIAGNOSIS — Z91040 Latex allergy status: Secondary | ICD-10-CM | POA: Insufficient documentation

## 2023-11-13 MED ORDER — ACETAMINOPHEN 500 MG PO TABS
1000.0000 mg | ORAL_TABLET | Freq: Once | ORAL | Status: AC
  Administered 2023-11-13: 1000 mg via ORAL
  Filled 2023-11-13: qty 2

## 2023-11-13 MED ORDER — METHOCARBAMOL 500 MG PO TABS: 500.0000 mg | ORAL_TABLET | Freq: Two times a day (BID) | ORAL | 0 refills | Status: AC
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
## 2023-11-13 NOTE — ED Triage Notes (Signed)
 Pt fell down about 5 steps at work onto concrete; sts she hit her head on concrete, denies LOC, no obvious abnormality; does not take blood thinners; c/o pain to head, LT shoulder, RT shin, LT knee

## 2023-11-15 NOTE — Telephone Encounter (Signed)
 Ok to close

## 2023-12-27 ENCOUNTER — Other Ambulatory Visit: Payer: Self-pay | Admitting: Nurse Practitioner

## 2023-12-27 DIAGNOSIS — M810 Age-related osteoporosis without current pathological fracture: Secondary | ICD-10-CM

## 2023-12-27 NOTE — Telephone Encounter (Signed)
 Med refill request: fosamax   Last AEX: 08/06/23 Next AEX: not scheduled  Last MMG (if hormonal med) 10/15/23 birads cat 1 neg  Refill authorized: last rx 10/07/23 #12 with 0 refills. Please approve or deny

## 2024-02-04 ENCOUNTER — Ambulatory Visit: Admitting: Obstetrics and Gynecology

## 2024-02-17 NOTE — Telephone Encounter (Signed)
 Call placed to patient, left detailed message, ok per dpr. Advised as seen below per [facility], return call to office if any additional questions.   Encounter closed.

## 2024-02-17 NOTE — Telephone Encounter (Signed)
 It appears she was seen in the ED in March for a fall where she did endorse hitting her head and upper body. I recommend she follow up with Orthopedics to rule out any underlying injury. I do not think it is solely menopause related.

## 2024-03-20 ENCOUNTER — Other Ambulatory Visit: Payer: Self-pay | Admitting: Radiology

## 2024-03-20 DIAGNOSIS — M810 Age-related osteoporosis without current pathological fracture: Secondary | ICD-10-CM

## 2024-03-20 NOTE — Telephone Encounter (Signed)
.  Med refill request: Alendronate  70MG  tabs   Last AEX: 08/06/23 Next AEX: Not scheduled  Last MMG (if hormonal med) 10/15/23 Bi- Rads Cat. 1 Neg Refill authorized: Please Advise?

## 2024-03-24 ENCOUNTER — Other Ambulatory Visit: Payer: Self-pay | Admitting: Radiology

## 2024-03-24 DIAGNOSIS — M810 Age-related osteoporosis without current pathological fracture: Secondary | ICD-10-CM

## 2024-03-24 NOTE — Telephone Encounter (Signed)
 Med refill request: alendronate  70 mg Last AEX: Next AEX: Last MMG (if hormonal med) Refill authorized: Please Advise?

## 2024-06-03 ENCOUNTER — Other Ambulatory Visit: Payer: Self-pay | Admitting: *Deleted

## 2024-06-03 DIAGNOSIS — M810 Age-related osteoporosis without current pathological fracture: Secondary | ICD-10-CM

## 2024-06-03 MED ORDER — ALENDRONATE SODIUM 70 MG PO TABS: 70.0000 mg | ORAL_TABLET | ORAL | 0 refills | Status: DC

## 2024-06-03 NOTE — Telephone Encounter (Signed)
 Patient left message on triage line requesting refill of Alendronate  70 mg tab. States she will run out prior to AEX.    Last AEX: 08/06/23 -JC Next AEX: 08/19/24- JC Last MMG (if hormonal med) N/A BMD: 08/07/22, Osteoporosis, per report repeat 1-2 years Refill authorized: Please Advise?

## 2024-07-06 ENCOUNTER — Other Ambulatory Visit: Payer: Self-pay | Admitting: Radiology

## 2024-07-06 DIAGNOSIS — N958 Other specified menopausal and perimenopausal disorders: Secondary | ICD-10-CM

## 2024-07-06 NOTE — Telephone Encounter (Signed)
 Med refill request:   Estradiol  (IMVEXXY  MAINTENANCE PACK) 10 MCG INST  Start:  08/08/23  - Disp:  24 each  - Refills:   4  Last AEX:  08/06/23 Next AEX:  08/19/24 Last MMG (if hormonal med):  10/15/23 Refill authorized? Please Advise.

## 2024-08-06 ENCOUNTER — Ambulatory Visit: Admitting: Radiology

## 2024-08-19 ENCOUNTER — Encounter: Payer: Self-pay | Admitting: Radiology

## 2024-08-19 ENCOUNTER — Ambulatory Visit: Admitting: Radiology

## 2024-08-19 VITALS — BP 118/72 | HR 71 | Ht 60.75 in | Wt 135.0 lb

## 2024-08-19 DIAGNOSIS — Z01419 Encounter for gynecological examination (general) (routine) without abnormal findings: Secondary | ICD-10-CM | POA: Diagnosis not present

## 2024-08-19 DIAGNOSIS — M810 Age-related osteoporosis without current pathological fracture: Secondary | ICD-10-CM | POA: Diagnosis not present

## 2024-08-19 DIAGNOSIS — Z1331 Encounter for screening for depression: Secondary | ICD-10-CM | POA: Diagnosis not present

## 2024-08-19 DIAGNOSIS — E7800 Pure hypercholesterolemia, unspecified: Secondary | ICD-10-CM | POA: Diagnosis not present

## 2024-08-19 DIAGNOSIS — E559 Vitamin D deficiency, unspecified: Secondary | ICD-10-CM

## 2024-08-19 MED ORDER — ALENDRONATE SODIUM 70 MG PO TABS: 70.0000 mg | ORAL_TABLET | ORAL | 0 refills | Status: AC

## 2024-08-19 NOTE — Progress Notes (Signed)
° °  Joy Johns Jun 13, 1961 [id number]   History: Postmenopausal 63 y.o. presents for annual exam. Osteoporotic, on fosamax , due for DEXA. Recent meniscus repair left knee, ready to get back to exercise. Needs fasting labs, PCP did not complete at last visit.   Gynecologic History Postmenopausal Last Pap: 2023. Results were: normal Last mammogram: 2/25. Results were: normal Last colonoscopy: 2023, normal repeat 10 years HRT use: never  Obstetric History OB History  Gravida Para Term Preterm AB Living  3 2    2   SAB IAB Ectopic Multiple Live Births          # Outcome Date GA Lbr Len/2nd Weight Sex Type Anes PTL Lv  3 Gravida           2 Para           1 Para              The following portions of the patient's history were reviewed and updated as appropriate: allergies, current medications, past family history, past medical history, past social history, past surgical history, and problem list.  Review of Systems Pertinent items noted in HPI and remainder of comprehensive ROS otherwise negative.  Past medical history, past surgical history, family history and social history were all reviewed and documented in the EPIC chart.  Exam:  Vitals:   08/19/24 1015  BP: 118/72  Pulse: 71  SpO2: 98%  Weight: 135 lb (61.2 kg)  Height: 5' 0.75 (1.543 m)    Body mass index is 25.72 kg/m.  General appearance:  Normal Thyroid :  Symmetrical, normal in size, without palpable masses or nodularity. Respiratory  Auscultation:  Clear without wheezing or rhonchi Cardiovascular  Auscultation:  Regular rate, without rubs, murmurs or gallops  Edema/varicosities:  Not grossly evident Abdominal  Soft,nontender, without masses, guarding or rebound.  Liver/spleen:  No organomegaly noted  Hernia:  None appreciated  Skin  Inspection:  Grossly normal Breasts: Examined lying and sitting.   Right: Without masses, retractions, nipple discharge or axillary adenopathy.   Left: Without masses,  retractions, nipple discharge or axillary adenopathy. Genitourinary   Inguinal/mons:  Normal without inguinal adenopathy  External genitalia:  Normal appearing vulva with no masses, tenderness, or lesions  BUS/Urethra/Skene's glands:  Normal  Vagina:  Normal appearing with normal color and discharge, no lesions. Atrophy: moderate but improved  Cervix:  Normal appearing without discharge or lesions  Uterus:  Normal in size, shape and contour.  Midline and mobile, nontender  Adnexa/parametria:     Rt: Normal in size, without masses or tenderness.   Lt: Normal in size, without masses or tenderness.  Anus and perineum: Normal    Joy Johns, CMA present for exam  Assessment/Plan:   1. Well woman exam with routine gynecological exam (Primary)  2. Hypercholesteremia - Lipid Profile  3. Age-related osteoporosis without current pathological fracture - CBC - Vitamin D  (25 hydroxy) - DG Bone Density; Future - alendronate  (FOSAMAX ) 70 MG tablet; Take 1 tablet (70 mg total) by mouth once a week. Take with a full glass of water on an empty stomach.  Dispense: 12 tablet; Refill: 0 - Comprehensive metabolic panel with GFR  4. Vitamin D  deficiency - Vitamin D  (25 hydroxy)  5. Depression screen  Return in 1 year for annual or sooner prn.  Joy Johns B WHNP-BC, 10:51 AM 08/19/2024

## 2024-08-20 LAB — COMPREHENSIVE METABOLIC PANEL WITH GFR
AG Ratio: 2 (calc) (ref 1.0–2.5)
ALT: 18 U/L (ref 6–29)
AST: 30 U/L (ref 10–35)
Albumin: 4.3 g/dL (ref 3.6–5.1)
Alkaline phosphatase (APISO): 44 U/L (ref 37–153)
BUN: 14 mg/dL (ref 7–25)
CO2: 27 mmol/L (ref 20–32)
Calcium: 9.3 mg/dL (ref 8.6–10.4)
Chloride: 97 mmol/L — ABNORMAL LOW (ref 98–110)
Creat: 0.77 mg/dL (ref 0.50–1.05)
Globulin: 2.2 g/dL (ref 1.9–3.7)
Glucose, Bld: 88 mg/dL (ref 65–99)
Potassium: 5.2 mmol/L (ref 3.5–5.3)
Sodium: 131 mmol/L — ABNORMAL LOW (ref 135–146)
Total Bilirubin: 0.4 mg/dL (ref 0.2–1.2)
Total Protein: 6.5 g/dL (ref 6.1–8.1)
eGFR: 87 mL/min/1.73m2 (ref >=60)

## 2024-08-20 LAB — LIPID PANEL
Cholesterol: 236 mg/dL — ABNORMAL HIGH (ref <200)
HDL: 93 mg/dL (ref >=50)
LDL Cholesterol (Calc): 128 mg/dL — ABNORMAL HIGH
Non-HDL Cholesterol (Calc): 143 mg/dL — ABNORMAL HIGH (ref <130)
Total CHOL/HDL Ratio: 2.5 (calc) (ref <5.0)
Triglycerides: 59 mg/dL (ref <150)

## 2024-08-20 LAB — CBC
HCT: 37.7 % (ref 35.9–46.0)
Hemoglobin: 12.3 g/dL (ref 11.7–15.5)
MCH: 29 pg (ref 27.0–33.0)
MCHC: 32.6 g/dL (ref 31.6–35.4)
MCV: 88.9 fL (ref 81.4–101.7)
MPV: 10.5 fL (ref 7.5–12.5)
Platelets: 244 Thousand/uL (ref 140–400)
RBC: 4.24 Million/uL (ref 3.80–5.10)
RDW: 12.8 % (ref 11.0–15.0)
WBC: 4.3 Thousand/uL (ref 3.8–10.8)

## 2024-08-20 LAB — VITAMIN D 25 HYDROXY (VIT D DEFICIENCY, FRACTURES): Vit D, 25-Hydroxy: 44 ng/mL (ref 30–100)

## 2024-08-21 ENCOUNTER — Ambulatory Visit
Admission: RE | Admit: 2024-08-21 | Discharge: 2024-08-21 | Disposition: A | Discharge status: Home / Self Care | Payer: Self-pay | Source: Ambulatory Visit | Attending: Radiology | Admitting: Radiology

## 2024-08-21 DIAGNOSIS — M810 Age-related osteoporosis without current pathological fracture: Secondary | ICD-10-CM | POA: Insufficient documentation

## 2024-09-22 ENCOUNTER — Ambulatory Visit (HOSPITAL_BASED_OUTPATIENT_CLINIC_OR_DEPARTMENT_OTHER): Payer: Self-pay | Admitting: Cardiology

## 2024-09-23 ENCOUNTER — Ambulatory Visit (HOSPITAL_BASED_OUTPATIENT_CLINIC_OR_DEPARTMENT_OTHER): Payer: Self-pay | Admitting: Cardiology

## 2024-09-23 ENCOUNTER — Encounter (HOSPITAL_BASED_OUTPATIENT_CLINIC_OR_DEPARTMENT_OTHER): Payer: Self-pay | Admitting: Cardiology

## 2024-09-23 VITALS — BP 124/62 | HR 70 | Ht 60.75 in | Wt 136.9 lb

## 2024-09-23 DIAGNOSIS — Z8249 Family history of ischemic heart disease and other diseases of the circulatory system: Secondary | ICD-10-CM | POA: Diagnosis not present

## 2024-09-23 DIAGNOSIS — Z7189 Other specified counseling: Secondary | ICD-10-CM

## 2024-09-23 DIAGNOSIS — E7800 Pure hypercholesterolemia, unspecified: Secondary | ICD-10-CM

## 2024-09-23 NOTE — Progress Notes (Signed)
 " Cardiology Office Note:  .   Date:  09/23/2024  ID:  Joy Johns, DOB 04/22/1961, MRN [id number] PCP: Cleotilde Planas, MD  [facility] Providers Cardiologist:  Shelda Bruckner, MD {  History of Present Illness: .   Joy Johns is a 64 y.o. female with PMH family history of heart disease. I met her once in 01/2021, but she has not been seen by cardiology since that time.   Family history: Mother had stent in her mid-70's, hypertension, hyperlipemia, overweight. Father's hx unknown. Oldest brother had cardiomyopathy, severe heart failure possibly due blockages and alcohol/diet, kidney damage. Second Brother had MI, LAD, stent, drinks alcohol, active/not overweight. Third brother had MI, LAD, stent.    Coronary CT 05/2021 with calcium score 0, no CAD  Today: Overall doing well. All 4 of her brothers have now had MI's starting at age 42. No significant comorbidities to explain the family disease. Reviewed her most recent lipids from 08/19/24, LDL 128 (overall Tchol elevated but this is from excellent HDL). Cholesterol didn't change much with weight loss.   Discussed lpa today.  ROS: Denies chest pain, shortness of breath at rest or with normal exertion. No PND, orthopnea, LE edema or unexpected weight gain. No syncope or palpitations. ROS otherwise negative except as noted.   Studies Reviewed: SABRA    EKG:  EKG Interpretation Date/Time:  Wednesday September 23 2024 15:13:09 EST Ventricular Rate:  71 PR Interval:  134 QRS Duration:  72 QT Interval:  392 QTC Calculation: 425 R Axis:   52  Text Interpretation: Normal sinus rhythm Low voltage QRS Confirmed by Bruckner Shelda [phone]) on 09/23/2024 3:54:58 PM    Physical Exam:   VS:  BP 124/62   Pulse 70   Ht 5' 0.75 (1.543 m)   Wt 136 lb 14.4 oz (62.1 kg)   SpO2 98%   BMI 26.08 kg/m    Wt Readings from Last 3 Encounters:  09/23/24 136 lb 14.4 oz (62.1 kg)  08/19/24 135 lb (61.2 kg)  11/13/23 135 lb (61.2 kg)     GEN: Well nourished, well developed in no acute distress HEENT: Normal, moist mucous membranes NECK: No JVD CARDIAC: regular rhythm, normal S1 and S2, no rubs or gallops. No murmur. VASCULAR: Radial and DP pulses 2+ bilaterally. No carotid bruits RESPIRATORY:  Clear to auscultation without rales, wheezing or rhonchi  ABDOMEN: Soft, non-tender, non-distended MUSCULOSKELETAL:  Ambulates independently SKIN: Warm and dry, no edema NEUROLOGIC:  Alert and oriented x 3. No focal neuro deficits noted. PSYCHIATRIC:  Normal affect    ASSESSMENT AND PLAN: .    Family history of heart disease Hypercholesterolemia -no coronary calcium or CAD noted on coronary CT 2022 -reviewed lipids from 08/2024, excellent HDL at 93, TG 59, LDL 128 -discussed lpa today, she is amenable  CV risk counseling and prevention -recommend heart healthy/Mediterranean diet, with whole grains, fruits, vegetable, fish, lean meats, nuts, and olive oil. Limit salt. -recommend moderate walking, 3-5 times/week for 30-50 minutes each session. Aim for at least 150 minutes/week. Goal should be pace of 3 miles/hours, or walking 1.5 miles in 30 minutes -recommend avoidance of tobacco products. Avoid excess alcohol. -ASCVD risk score: The 10-year ASCVD risk score (Arnett DK, et al., 2019) is: 3.6%   Values used to calculate the score:     Age: 32 years     Clinically relevant sex: Female     Is Non-Hispanic African American: No     Diabetic: No  Tobacco smoker: No     Systolic Blood Pressure: 124 mmHg     Is BP treated: No     HDL Cholesterol: 93 mg/dL     Total Cholesterol: 236 mg/dL    Dispo: I would be happy to see her back as needed if lpa unremarkable  Signed, Shelda Bruckner, MD   Shelda Bruckner, MD, PhD, [facility] [place]  [facility]  [facility] [address] [place], [place] [address] [phone]   "

## 2024-09-23 NOTE — Patient Instructions (Signed)
 Medication Instructions:  No changes *If you need a refill on your cardiac medications before your next appointment, please call your pharmacy*  Lab Work: Today: LPa  If you have labs (blood work) drawn today and your tests are completely normal, you will receive your results only by: MyChart Message (if you have MyChart) OR A paper copy in the mail If you have any lab test that is abnormal or we need to change your treatment, we will call you to review the results.  Testing/Procedures: none  Follow-Up: As needed

## 2024-09-24 LAB — LIPOPROTEIN A (LPA): Lipoprotein (a): 24.7 nmol/L

## 2024-10-05 ENCOUNTER — Ambulatory Visit (HOSPITAL_BASED_OUTPATIENT_CLINIC_OR_DEPARTMENT_OTHER): Payer: Self-pay | Admitting: Cardiology
